# Patient Record
Sex: Male | Born: 1985 | Race: White | Hispanic: No | Marital: Married | Smoking: Former smoker
Health system: Southern US, Community
[De-identification: ages and names within clinical notes are randomized; demographics above are authoritative.]

## PROBLEM LIST (undated history)

## (undated) DIAGNOSIS — Z87442 Personal history of urinary calculi: Secondary | ICD-10-CM

## (undated) DIAGNOSIS — D649 Anemia, unspecified: Secondary | ICD-10-CM

## (undated) DIAGNOSIS — I499 Cardiac arrhythmia, unspecified: Secondary | ICD-10-CM

## (undated) DIAGNOSIS — T8859XA Other complications of anesthesia, initial encounter: Secondary | ICD-10-CM

## (undated) DIAGNOSIS — Z9689 Presence of other specified functional implants: Secondary | ICD-10-CM

## (undated) DIAGNOSIS — I4891 Unspecified atrial fibrillation: Secondary | ICD-10-CM

## (undated) DIAGNOSIS — F909 Attention-deficit hyperactivity disorder, unspecified type: Secondary | ICD-10-CM

## (undated) DIAGNOSIS — R569 Unspecified convulsions: Secondary | ICD-10-CM

## (undated) DIAGNOSIS — M5126 Other intervertebral disc displacement, lumbar region: Secondary | ICD-10-CM

## (undated) DIAGNOSIS — G4730 Sleep apnea, unspecified: Secondary | ICD-10-CM

## (undated) DIAGNOSIS — U071 COVID-19: Secondary | ICD-10-CM

## (undated) HISTORY — DX: Cardiac arrhythmia, unspecified: I49.9

## (undated) HISTORY — PX: FINGER FRACTURE SURGERY: SHX638

## (undated) HISTORY — DX: Unspecified convulsions: R56.9

## (undated) HISTORY — DX: Unspecified atrial fibrillation: I48.91

---

## 2015-12-23 DIAGNOSIS — Z9689 Presence of other specified functional implants: Secondary | ICD-10-CM

## 2015-12-23 HISTORY — DX: Presence of other specified functional implants: Z96.89

## 2016-09-21 HISTORY — PX: IMPLANTATION VAGAL NERVE STIMULATOR: SUR692

## 2017-08-12 ENCOUNTER — Encounter: Payer: Self-pay | Admitting: Registered"

## 2017-08-12 ENCOUNTER — Encounter: Payer: BC Managed Care – PPO | Attending: Surgery | Admitting: Registered"

## 2017-08-12 DIAGNOSIS — Z6841 Body Mass Index (BMI) 40.0 and over, adult: Secondary | ICD-10-CM | POA: Diagnosis present

## 2017-08-12 DIAGNOSIS — E669 Obesity, unspecified: Secondary | ICD-10-CM

## 2017-08-12 NOTE — Progress Notes (Signed)
Pre-Op Assessment Visit:  Pre-Operative Sleeve Gastrectomy Surgery  Medical Nutrition Therapy:  Appt start time: 10:20  End time:  11:34  Patient was seen on 08/12/2017 for Pre-Operative Nutrition Assessment. Assessment and letter of approval faxed to [facility] Bariatric Surgery Program coordinator on 08/12/2017.   Pt expectation of surgery: reduce medications, improve seizures, improve sleep apnea; about 230 lbs  Pt expectation of Dietitian: "get me to where I need to be"; answer questions on what to eat/not eat; good collaboration  Start weight at [facility]: 367.3  BMI: 49.47   Pt states he has gained 150 within last 7 years ago. Pt states he has a lot of support. Pt reports he finds comfort in food. Pt states he does not drink enough water.  Per insurance, pt needs 0 SWL visits prior to surgery.    24 hr Dietary Recall: First Meal: eggs or cereal and chicken nuggets or tuna salad and chicken nuggets Snack: none Second Meal: protein bar Snack: none Third Meal: 3 brats, sweet potato fries Snack: none Beverages: diet soda, water, gatorade  Encouraged to engage in 150 minutes of moderate physical activity including cardiovascular and weight baring weekly  Handouts given during visit include:  . Pre-Op Goals . Bariatric Surgery Protein Shakes . Vitamin and Mineral Recommendations  During the appointment today the following Pre-Op Goals were reviewed with the patient: . Maintain or lose weight as instructed by your surgeon . Make healthy food choices . Begin to limit portion sizes . Limited concentrated sugars and fried foods . Keep fat/sugar in the single digits per serving on          food labels . Practice CHEWING your food  (aim for 30 chews per bite or until applesauce consistency) . Practice not drinking 15 minutes before, during, and 30 minutes after each meal/snack . Avoid all carbonated beverages  . Avoid/limit caffeinated beverages  . Avoid all  sugar-sweetened beverages . Consume 3 meals per day; eat every 3-5 hours . Make a list of non-food related activities . Aim for 64-100 ounces of FLUID daily  . Aim for at least 60-80 grams of PROTEIN daily . Look for a liquid protein source that contain ?15 g protein and ?5 g carbohydrate  (ex: shakes, drinks, shots) . Physical activity is an important part of a healthy lifestyle so keep it moving!  Follow diet recommendations listed below Energy and Macronutrient Recommendations: Calories: 2000 Carbohydrate: 225 Protein: 150 Fat: 56  Demonstrated degree of understanding via:  Teach Back   Teaching Method Utilized:  Visual Auditory Hands on  Barriers to learning/adherence to lifestyle change: none  Patient to call the [facility] to enroll in Pre-Op and Post-Op Nutrition Education when surgery date is scheduled.

## 2017-08-31 ENCOUNTER — Ambulatory Visit (INDEPENDENT_AMBULATORY_CARE_PROVIDER_SITE_OTHER): Payer: BC Managed Care – PPO | Admitting: Cardiology

## 2017-08-31 ENCOUNTER — Encounter: Payer: Self-pay | Admitting: Cardiology

## 2017-08-31 VITALS — BP 118/74 | HR 87 | Ht 73.0 in | Wt 372.8 lb

## 2017-08-31 DIAGNOSIS — I480 Paroxysmal atrial fibrillation: Secondary | ICD-10-CM

## 2017-08-31 DIAGNOSIS — Z01810 Encounter for preprocedural cardiovascular examination: Secondary | ICD-10-CM

## 2017-08-31 DIAGNOSIS — I10 Essential (primary) hypertension: Secondary | ICD-10-CM

## 2017-08-31 MED ORDER — RIVAROXABAN 20 MG PO TABS: 20.0000 mg | ORAL_TABLET | Freq: Every day | ORAL | 1 refills | Status: DC

## 2017-08-31 NOTE — Patient Instructions (Signed)
Medication Instructions:   Your physician has recommended you make the following change in your medication:   CHANGE: Xarelto from 15 mg to 20 mg Once daily.     Labwork:  NONE   Testing/Procedures:  Your physician has requested that you have a lexiscan myoview. For further information please visit [email or web address]. Please follow instruction sheet, as given.    Follow-Up:  Your physician recommends that you schedule a follow-up appointment in: 3 months with Dr. Tomie Chinaevankar.    Any Other Special Instructions Will Be Listed Below (If Applicable).     If you need a refill on your cardiac medications before your next appointment, please call your pharmacy.

## 2017-08-31 NOTE — Progress Notes (Signed)
Cardiology Office Note:    Date:  08/31/2017   ID:  Scott Macdonald, DOB [birth year], MRN [id number]  PCP:  Courtney Heys, PA-C  Cardiologist:  Garwin Brothers, MD   Referring MD: Courtney Heys,*    ASSESSMENT:    1. Pre-operative cardiovascular examination   2. Paroxysmal atrial fibrillation (HCC)   3. Essential hypertension   4. Morbid obesity (HCC)    PLAN:    In order of problems listed above:  1. I discussed my findings with the patient at extensive length. Patient has paroxysmal atrial fibrillation based on this history. I'm requesting records from [facility] including echocardiogram and is ready stress test was performed. 2. His blood pressure is stable at this time. I would hate to duplicate any tests done recently. I will do a Lexiscan sestamibi and echocardiogram result and based on what records I received I we will make further recommendations. 3. I discussed with the patient atrial fibrillation, disease process. Management and therapy including rate and rhythm control, anticoagulation benefits and potential risks were discussed extensively with the patient. Patient had multiple questions which were answered to patient's satisfaction. The patient is on anticoagulation and I presume this short-term but again I we will have all these answered after reviewing his records. At any. Benefits and potential risks of amiodarone therapy was discussed with him and questions were answered to his satisfaction and he vocalized understanding. I'm not sure whether long term amiodarone is a good idea in this gentleman who is a young however I we'll let him use it for a period of time in the perioperative period so it will reducehis morbidities. He erbalized Understanding of this discussion. Again More will be done after reviewing his records. Diet was discussed for obesity and this of obesity explained and he verbalized understanding. He is planning to  undergo bariatric intervention which is why he is here for preoperative consultation.   Medication Adjustments/Labs and Tests Ordered: Current medicines are reviewed at length with the patient today.  Concerns regarding medicines are outlined above.  Orders Placed This Encounter  Procedures  . Myocardial Perfusion Imaging  . EKG 12-Lead   Meds ordered this encounter  Medications  . rivaroxaban (XARELTO) 20 MG TABS tablet    Sig: Take 1 tablet (20 mg total) by mouth daily with supper.    Dispense:  30 tablet    Refill:  1     History of Present Illness:    Scott Macdonald is a 31 y.o. male who is being seen today for the evaluation of pre-op risk stratification from a cardiovascular standpoint at the request of Courtney Heys,*. Patient is a pleasant 31 year old male. He has past medical history of essential hypertension, morbid obesity and paroxysmal atrial fibrillation.I do not have all his records. He has been treated for atrial fibrillation and takes amiodarone 200 mg a day and xarelto 15 mg daily. He denies any chest pain orthopnea or PND. He leads a sedentary lifestyle. 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  Past Medical History:  Diagnosis Date  . A-fib (HCC)   . Arrhythmia   . Seizures (HCC)     History reviewed. No pertinent surgical history.  Current Medications: Current Meds  Medication Sig  . amiodarone (PACERONE) 100 MG tablet Take 400 mg by mouth daily.  Marland Kitchen aspirin 81 MG chewable tablet Chew by mouth daily.  . divalproex (DEPAKOTE) 500 MG DR tablet Take 1,000 mg by mouth  2 (two) times daily.   Marland Kitchen. lisinopril (PRINIVIL,ZESTRIL) 10 MG tablet Take 25 mg by mouth daily.  Marland Kitchen. loratadine (CLARITIN) 10 MG tablet Take 10 mg by mouth daily.  . metoprolol succinate (TOPROL-XL) 100 MG 24 hr tablet Take 50 mg by mouth daily. Take with or immediately following a meal.  . PARoxetine (PAXIL) 10 MG tablet Take 10 mg by mouth daily.  . rivaroxaban (XARELTO) 20  MG TABS tablet Take 1 tablet (20 mg total) by mouth daily with supper.  . [DISCONTINUED] Rivaroxaban (XARELTO) 15 MG TABS tablet Take 20 mg by mouth 2 (two) times daily with a meal.     Allergies:   Lorazepam and Prednisone   Social History   Social History  . Marital status: Unknown    Spouse name: N/A  . Number of children: N/A  . Years of education: N/A   Social History Main Topics  . Smoking status: Former Smoker    Quit date: 09/01/2007  . Smokeless tobacco: Never Used  . Alcohol use Yes     Comment: OCC  . Drug use: No  . Sexual activity: Not Asked   Other Topics Concern  . None   Social History Narrative  . None     Family History: The patient's family history includes COPD in his other; Cancer in his other; Hypertension in his brother, father, mother, and other.  ROS:   Please see the history of present illness.    All other systems reviewed and are negative.  EKGs/Labs/Other Studies Reviewed:    The following studies were reviewed today: I reviewed records on him and questions were answered to his satisfaction. He is currently in sinus rhythm.   Recent Labs: No results found for requested labs within last 8760 hours.  Recent Lipid Panel No results found for: CHOL, TRIG, HDL, CHOLHDL, VLDL, LDLCALC, LDLDIRECT  Physical Exam:    VS:  BP 118/74   Pulse 87   Ht 6\' 1"  (1.854 m)   Wt (!) 372 lb 12.8 oz (169.1 kg)   SpO2 97%   BMI 49.19 kg/m     Wt Readings from Last 3 Encounters:  08/31/17 (!) 372 lb 12.8 oz (169.1 kg)  08/12/17 (!) 367 lb 4.8 oz (166.6 kg)     GEN: Patient is in no acute distress HEENT: Normal NECK: No JVD; No carotid bruits LYMPHATICS: No lymphadenopathy CARDIAC: S1 S2 regular, 2/6 systolic murmur at the apex. RESPIRATORY:  Clear to auscultation without rales, wheezing or rhonchi  ABDOMEN: Soft, non-tender, non-distended MUSCULOSKELETAL:  No edema; No deformity  SKIN: Warm and dry NEUROLOGIC:  Alert and oriented x  3 PSYCHIATRIC:  Normal affect    Signed, Garwin Brothersajan R Nyko Gell, MD  08/31/2017 1:32 PM    [facility]

## 2017-09-01 ENCOUNTER — Telehealth (HOSPITAL_COMMUNITY): Payer: Self-pay | Admitting: *Deleted

## 2017-09-01 NOTE — Telephone Encounter (Signed)
Patient given detailed instructions per Myocardial Perfusion Study Information Sheet for the test on 0912/18 at 1230. Patient notified to arrive 15 minutes early and that it is imperative to arrive on time for appointment to keep from having the test rescheduled.  If you need to cancel or reschedule your appointment, please call the office within 24 hours of your appointment. . Patient verbalized understanding.Caliber Landess W    

## 2017-09-02 ENCOUNTER — Ambulatory Visit (HOSPITAL_COMMUNITY): Payer: BC Managed Care – PPO | Attending: Cardiovascular Disease

## 2017-09-02 DIAGNOSIS — Z01810 Encounter for preprocedural cardiovascular examination: Secondary | ICD-10-CM | POA: Insufficient documentation

## 2017-09-02 MED ORDER — REGADENOSON 0.4 MG/5ML IV SOLN
0.4000 mg | Freq: Once | INTRAVENOUS | Status: AC
  Administered 2017-09-02: 0.4 mg via INTRAVENOUS

## 2017-09-02 MED ORDER — TECHNETIUM TC 99M TETROFOSMIN IV KIT
31.9000 | PACK | Freq: Once | INTRAVENOUS | Status: AC | PRN
  Administered 2017-09-02: 31.9 via INTRAVENOUS
  Filled 2017-09-02: qty 32

## 2017-09-03 ENCOUNTER — Ambulatory Visit (HOSPITAL_COMMUNITY): Payer: BC Managed Care – PPO | Attending: Cardiovascular Disease

## 2017-09-03 LAB — MYOCARDIAL PERFUSION IMAGING
CHL CUP NUCLEAR SSS: 6
CSEPPHR: 114 {beats}/min
LV dias vol: 214 mL (ref 62–150)
LV sys vol: 99 mL
NUC STRESS TID: 1.04
RATE: 0.37
Rest HR: 83 {beats}/min
SDS: 1
SRS: 7

## 2017-09-03 MED ORDER — TECHNETIUM TC 99M TETROFOSMIN IV KIT
31.6000 | PACK | Freq: Once | INTRAVENOUS | Status: AC | PRN
Start: 2017-09-03 — End: 2017-09-03
  Administered 2017-09-03: 31.6 via INTRAVENOUS
  Filled 2017-09-03: qty 32

## 2017-09-14 ENCOUNTER — Encounter: Payer: BC Managed Care – PPO | Attending: Surgery | Admitting: Skilled Nursing Facility1

## 2017-09-14 DIAGNOSIS — Z6841 Body Mass Index (BMI) 40.0 and over, adult: Secondary | ICD-10-CM | POA: Diagnosis present

## 2017-09-15 ENCOUNTER — Encounter: Payer: Self-pay | Admitting: Skilled Nursing Facility1

## 2017-09-15 NOTE — Progress Notes (Signed)
Pre-Operative Nutrition Class:  Appt start time: 1028   End time:  1830.  Patient was seen on 09/14/2017 for Pre-Operative Bariatric Surgery Education at [facility].   Surgery date:  Surgery type: Sleeve Start weight at [facility]: 367.3 Weight today: 370  Samples given per MNT protocol. Patient educated on appropriate usage: Bariatric Advantage Calcium  Lot # [id number] Exp: nov-01-2017  Celebrate Vitamins Multivitamin Lot # [id number] Exp: 11/2018  Renee Pain Protein Powder  Lot #[id number] Exp: 02/2018  The following the learning objectives were met by the patient during this course:  Identify Pre-Op Dietary Goals and will begin 2 weeks pre-operatively  Identify appropriate sources of fluids and proteins   State protein recommendations and appropriate sources pre and post-operatively  Identify Post-Operative Dietary Goals and will follow for 2 weeks post-operatively  Identify appropriate multivitamin and calcium sources  Describe the need for physical activity post-operatively and will follow MD recommendations  State when to call healthcare provider regarding medication questions or post-operative complications  Handouts given during class include:  Pre-Op Bariatric Surgery Diet Handout  Protein Shake Handout  Post-Op Bariatric Surgery Nutrition Handout  [facility] Program Information Flyer  Support Group Information Flyer  [facility] Bariatric Supplements Price List  Follow-Up Plan: Patient will follow-up at [facility] 2 weeks post operatively for diet advancement per MD.

## 2017-09-17 ENCOUNTER — Ambulatory Visit: Payer: BC Managed Care – PPO | Admitting: Cardiology

## 2017-09-17 ENCOUNTER — Ambulatory Visit (INDEPENDENT_AMBULATORY_CARE_PROVIDER_SITE_OTHER): Payer: BC Managed Care – PPO | Admitting: Psychiatry

## 2017-09-17 DIAGNOSIS — F509 Eating disorder, unspecified: Secondary | ICD-10-CM | POA: Diagnosis not present

## 2017-09-24 ENCOUNTER — Ambulatory Visit (INDEPENDENT_AMBULATORY_CARE_PROVIDER_SITE_OTHER): Payer: BC Managed Care – PPO | Admitting: Psychiatry

## 2017-09-24 DIAGNOSIS — F509 Eating disorder, unspecified: Secondary | ICD-10-CM | POA: Diagnosis not present

## 2017-10-08 ENCOUNTER — Other Ambulatory Visit (HOSPITAL_COMMUNITY): Payer: Self-pay | Admitting: Surgery

## 2017-10-12 ENCOUNTER — Ambulatory Visit (HOSPITAL_COMMUNITY)
Admission: RE | Admit: 2017-10-12 | Discharge: 2017-10-12 | Disposition: A | Discharge status: Home / Self Care | Payer: BC Managed Care – PPO | Source: Ambulatory Visit | Attending: Surgery | Admitting: Surgery

## 2017-10-12 ENCOUNTER — Other Ambulatory Visit: Payer: Self-pay

## 2017-10-12 DIAGNOSIS — Z01818 Encounter for other preprocedural examination: Secondary | ICD-10-CM | POA: Diagnosis not present

## 2017-10-21 ENCOUNTER — Ambulatory Visit: Payer: BC Managed Care – PPO | Admitting: Cardiology

## 2017-10-22 ENCOUNTER — Telehealth: Payer: Self-pay | Admitting: Cardiology

## 2017-10-22 NOTE — Telephone Encounter (Signed)
States his clearance letter for bariatric surgery has still not been received

## 2017-10-22 NOTE — Telephone Encounter (Signed)
[facility]

## 2017-10-28 ENCOUNTER — Telehealth: Payer: Self-pay

## 2017-10-28 NOTE — Telephone Encounter (Signed)
Faxed medical clearance form to Armen with Vascular surgery at [phone].

## 2017-10-30 NOTE — Progress Notes (Signed)
Please place orders in Epic as patient has a pre-op appointment on 11/02/2017! Thank you! 

## 2017-11-02 ENCOUNTER — Encounter (HOSPITAL_COMMUNITY)
Admission: RE | Admit: 2017-11-02 | Discharge: 2017-11-02 | Disposition: A | Discharge status: Home / Self Care | Payer: BC Managed Care – PPO | Source: Ambulatory Visit | Attending: Surgery | Admitting: Surgery

## 2017-11-02 NOTE — Progress Notes (Signed)
10/12/2017- noted in Epic- EKG, CXR  09/03/2017-noted in Epic-Stress test  10/12/2017- on chart- labs, CBC w/diff., CMP  10/28/2017- Cardiac Clearance from Dr. Normajean Baxteravankar on chart.

## 2017-11-02 NOTE — Patient Instructions (Signed)
Sanjuana Kavaimothy Schuermann  11/02/2017   Your procedure is scheduled on: Monday 11/09/2017  [facility] [place] to 3rd floor to  Short Stay Center at  0515  AM.     Call this number if you have problems the morning of surgery [phone]   Remember: ONLY 1 PERSON MAY GO WITH YOU TO SHORT STAY TO GET  READY MORNING OF YOUR SURGERY.   Do not eat food or drink liquids :After Midnight.     Take these medicines the morning of surgery with A SIP OF WATER: Metoprolol, Depakote, Paxil                                 You may not have any metal on your body including hair pins and              piercings  Do not wear jewelry, make-up, lotions, powders or perfumes, deodorant             Do not wear nail polish.  Do not shave  48 hours prior to surgery.              Men may shave face and neck.   Do not bring valuables to the hospital.  IS NOT             RESPONSIBLE   FOR VALUABLES.  Contacts, dentures or bridgework may not be worn into surgery.  Leave suitcase in the car. After surgery it may be brought to your room.                  Please read over the following fact sheets you were given: _____________________________________________________________________             [facility] - Preparing for Surgery Before surgery, you can play an important role.  Because skin is not sterile, your skin needs to be as free of germs as possible.  You can reduce the number of germs on your skin by washing with CHG (chlorahexidine gluconate) soap before surgery.  CHG is an antiseptic cleaner which kills germs and bonds with the skin to continue killing germs even after washing. Please DO NOT use if you have an allergy to CHG or antibacterial soaps.  If your skin becomes reddened/irritated stop using the CHG and inform your nurse when you arrive at Short Stay. Do not shave (including legs and underarms) for at least 48 hours prior to the  first CHG shower.  You may shave your face/neck. Please follow these instructions carefully:  1.  Shower with CHG Soap the night before surgery and the  morning of Surgery.  2.  If you choose to wash your hair, wash your hair first as usual with your  normal  shampoo.  3.  After you shampoo, rinse your hair and body thoroughly to remove the  shampoo.                           4.  Use CHG as you would any other liquid soap.  You can apply chg directly  to the skin and wash                       Gently with a scrungie or  clean washcloth.  5.  Apply the CHG Soap to your body ONLY FROM THE NECK DOWN.   Do not use on face/ open                           Wound or open sores. Avoid contact with eyes, ears mouth and genitals (private parts).                       Wash face,  Genitals (private parts) with your normal soap.             6.  Wash thoroughly, paying special attention to the area where your surgery  will be performed.  7.  Thoroughly rinse your body with warm water from the neck down.  8.  DO NOT shower/wash with your normal soap after using and rinsing off  the CHG Soap.                9.  Pat yourself dry with a clean towel.            10.  Wear clean pajamas.            11.  Place clean sheets on your bed the night of your first shower and do not  sleep with pets. Day of Surgery : Do not apply any lotions/deodorants the morning of surgery.  Please wear clean clothes to the hospital/surgery center.  FAILURE TO FOLLOW THESE INSTRUCTIONS MAY RESULT IN THE CANCELLATION OF YOUR SURGERY PATIENT SIGNATURE_________________________________  NURSE SIGNATURE__________________________________  ________________________________________________________________________   [name]  An incentive spirometer is a tool that can help keep your lungs clear and active. This tool measures how well you are filling your lungs with each breath. Taking long deep breaths may help reverse or decrease  the chance of developing breathing (pulmonary) problems (especially infection) following:  A long period of time when you are unable to move or be active. BEFORE THE PROCEDURE   If the spirometer includes an indicator to show your best effort, your nurse or respiratory therapist will set it to a desired goal.  If possible, sit up straight or lean slightly forward. Try not to slouch.  Hold the incentive spirometer in an upright position. INSTRUCTIONS FOR USE  1. Sit on the edge of your bed if possible, or sit up as far as you can in bed or on a chair. 2. Hold the incentive spirometer in an upright position. 3. Breathe out normally. 4. Place the mouthpiece in your mouth and seal your lips tightly around it. 5. Breathe in slowly and as deeply as possible, raising the piston or the ball toward the top of the column. 6. Hold your breath for 3-5 seconds or for as long as possible. Allow the piston or ball to fall to the bottom of the column. 7. Remove the mouthpiece from your mouth and breathe out normally. 8. Rest for a few seconds and repeat Steps 1 through 7 at least 10 times every 1-2 hours when you are awake. Take your time and take a few normal breaths between deep breaths. 9. The spirometer may include an indicator to show your best effort. Use the indicator as a goal to work toward during each repetition. 10. After each set of 10 deep breaths, practice coughing to be sure your lungs are clear. If you have an incision (the cut made at the time of surgery), support your incision when coughing  by placing a pillow or rolled up towels firmly against it. Once you are able to get out of bed, walk around indoors and cough well. You may stop using the incentive spirometer when instructed by your caregiver.  RISKS AND COMPLICATIONS  Take your time so you do not get dizzy or light-headed.  If you are in pain, you may need to take or ask for pain medication before doing incentive spirometry. It is  harder to take a deep breath if you are having pain. AFTER USE  Rest and breathe slowly and easily.  It can be helpful to keep track of a log of your progress. Your caregiver can provide you with a simple table to help with this. If you are using the spirometer at home, follow these instructions: [place] IF:   You are having difficultly using the spirometer.  You have trouble using the spirometer as often as instructed.  Your pain medication is not giving enough relief while using the spirometer.  You develop fever of 100.5 F (38.1 C) or higher. SEEK IMMEDIATE MEDICAL CARE IF:   You cough up bloody sputum that had not been present before.  You develop fever of 102 F (38.9 C) or greater.  You develop worsening pain at or near the incision site. MAKE SURE YOU:   Understand these instructions.  Will watch your condition.  Will get help right away if you are not doing well or get worse. Document Released: 04/20/2007 Document Revised: 03/01/2012 Document Reviewed: 06/21/2007 [facility] Patient Information 2014 [place], [place].   ________________________________________________________________________

## 2017-11-03 NOTE — Progress Notes (Signed)
Please place orders in Epic as patient has a pre-op appointment on 11/06/2017! Thank you! 

## 2017-11-04 ENCOUNTER — Ambulatory Visit: Payer: Self-pay | Admitting: Surgery

## 2017-11-04 NOTE — H&P (Signed)
Scott Macdonald 11/04/2017 3:51 PM Location: [facility] Patient #: [id number] DOB: 05/11/1986 Married / Language: [place] / Race: White Male  History of Present Illness (Shawnelle Spoerl A. Fredricka Bonineonnor MD; 11/04/2017 4:14 PM) Patient words: her today for his preoperative appointment. Has a few new questions which we discussed in detail. He is at his upper GI no hiatal hernia is present, chest x-Huebsch was normal, he has been cleared by psychology as well as dietitians. He's been cleared by cardiology and is deemed not at high risk from a cardiac standpoint for this procedure.  from initial visit: this is a very nice 31 year old man is here today with his wife to discuss. Check surgery. He is interested in sleeve gastrectomy. 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 These seemed to have been getting worse as time goes on in the last year so have become associated with paroxysmal atrial fibrillation although it is not clear whether the seizures trigger than A. fib or vice versa. He also has a history of severe obstructive sleep apnea. He has tried innumerable diets and methods of weight loss and will succeed, he has lost as much as 70 pounds, but he ultimately always gained it back and usually double what he lost. His motivations for pursuing weight loss surgery include the hope that it will improve his sleep apnea and subsequently improve his seizure disorder and his atrial fibrillation, in addition to the fact that he has 3 children for whom he wants to set an example. He discusses that he has a 32-year-old son who is already obese and had imitates everything that he does and he wants to do better example for him. He and his wife also want to have another baby and he feels that he needs to improve  his health significantly before bringing another trial into the world. He feels that he is an excellent support system at home. His mother-in-law actually had a sleeve gastrectomy.  The patient is a 31 year old male who presents for a bariatric surgery evaluation. Associated symptoms include poor self esteem, lost range of motion, joint pains and back pain, while associated symptoms do not include heartburn. Initial onset of obesity was in childhood. Patient's lowest weight in adulthood was 224 pounds. Disease complications include hypertension, sleep apnea and osteoarthritis, while disease complications do not include diabetes. Current diet includes well balanced meals. less than once per week. The patient is currently able to do activities of daily living without limitations and unable to work (due to seizures). Past treatment has included food diary, low calorie diet, low fat diet, exercise regimen, appetite suppressants and weight loss group. Surgical history: other (VNS stimulator in left upper chest for seizures). Cardiac history: The patient has sleep apnea and anti-coagulants (xarelto for paroxysmal a fib). Gastrointestinal History: Patient denies heartburn, dysphagia or irritable bowel disease. MBSQIP recognized comorbidities: Patient has hypertension and sleep apnea.  The patient is a 31 year old male.   Allergies (Tanisha A. Manson PasseyBrown, RMA; 11/04/2017 3:52 PM) PREDNISONE Anaphylaxis. Allergies Reconciled  Medication History (Tanisha A. Manson PasseyBrown, RMA; 11/04/2017 3:52 PM) Claritin (10MG  Tablet, Oral) Active. Divalproex Sodium ER (500MG  Tablet ER 24HR, Oral) Active. Amiodarone HCl (400MG  Tablet, Oral) Active. Metoprolol Succinate ER (50MG  Tablet ER 24HR, Oral) Active. Lisinopril (5MG  Tablet,  Oral) Active. Aspirin (81MG  Tablet, Oral) Active. Xarelto (20MG  Tablet, Oral) Active. PARoxetine HCl (10MG  Tablet, Oral) Active. Medications Reconciled     Review of Systems (Jerika Wales A.  Fredricka Bonineonnor MD; 11/04/2017 4:14 PM) All other systems negative  Vitals (Tanisha A. Brown RMA; 11/04/2017 3:51 PM) 11/04/2017 3:51 PM Weight: 381.6 lb Height: 71in Body Surface Area: 2.78 m Body Mass Index: 53.22 kg/m  Temp.: 97.44F  Pulse: 97 (Regular)  BP: 122/86 (Sitting, Left Arm, Standard)      Physical Exam (Bralynn Donado A. Fredricka Bonineonnor MD; 11/04/2017 4:15 PM)  The physical exam findings are as follows: Note:Alert and well-appearing, in no distress Unlabored respirations Abdomen soft and nontender    Assessment & Plan (Norie Latendresse A. Felcia Huebert MD; 11/04/2017 4:17 PM)  SLEEP APNEA (G47.30)  SEIZURE (R56.9) Story: He takes extended release Depakote which may need to be changed to capsules in the early postoperative period, we'll consult with pharmacy while inpatient  HYPERTENSION (I10) Story: He was told to hold his lisinopril the day of surgery but to continue his metoprolol  ATRIAL FIBRILLATION (I48.91) Story: He has been cleared by cardiology. He will hold his xarelto with last dose to be taken on Friday, surgeriy is monday  MORBID OBESITY (E66.01) Story: He is completely of the preoperative workup with no barriers identified. We again discussed the technique of laparoscopic sleeve gastrectomy, risks involved including bleeding, infection, pain, scarring, intra-abdominal injury, staple line leak, chronic nausea or reflux as well as risks of heart attack, stroke, blood clots, pneumonia, exacerbation of A. fib or seizures. He had many and several questions which were answered. We'll proceed with surgery on the 19th.

## 2017-11-04 NOTE — Progress Notes (Signed)
Please place orders in Epic as patient has a pre-op appointment on 11/05/2017! Thank you! 

## 2017-11-04 NOTE — H&P (View-Only) (Signed)
Scott Macdonald 11/04/2017 3:51 PM Location: [facility] Patient #: [id number] DOB: 05/11/1986 Married / Language: [place] / Race: White Male  History of Present Illness (Ruther Ephraim A. Fredricka Bonineonnor MD; 11/04/2017 4:14 PM) Patient words: her today for his preoperative appointment. Has a few new questions which we discussed in detail. He is at his upper GI no hiatal hernia is present, chest x-Huebsch was normal, he has been cleared by psychology as well as dietitians. He's been cleared by cardiology and is deemed not at high risk from a cardiac standpoint for this procedure.  from initial visit: this is a very nice 31 year old man is here today with his wife to discuss. Check surgery. He is interested in sleeve gastrectomy. 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 These seemed to have been getting worse as time goes on in the last year so have become associated with paroxysmal atrial fibrillation although it is not clear whether the seizures trigger than A. fib or vice versa. He also has a history of severe obstructive sleep apnea. He has tried innumerable diets and methods of weight loss and will succeed, he has lost as much as 70 pounds, but he ultimately always gained it back and usually double what he lost. His motivations for pursuing weight loss surgery include the hope that it will improve his sleep apnea and subsequently improve his seizure disorder and his atrial fibrillation, in addition to the fact that he has 3 children for whom he wants to set an example. He discusses that he has a 32-year-old son who is already obese and had imitates everything that he does and he wants to do better example for him. He and his wife also want to have another baby and he feels that he needs to improve  his health significantly before bringing another trial into the world. He feels that he is an excellent support system at home. His mother-in-law actually had a sleeve gastrectomy.  The patient is a 31 year old male who presents for a bariatric surgery evaluation. Associated symptoms include poor self esteem, lost range of motion, joint pains and back pain, while associated symptoms do not include heartburn. Initial onset of obesity was in childhood. Patient's lowest weight in adulthood was 224 pounds. Disease complications include hypertension, sleep apnea and osteoarthritis, while disease complications do not include diabetes. Current diet includes well balanced meals. less than once per week. The patient is currently able to do activities of daily living without limitations and unable to work (due to seizures). Past treatment has included food diary, low calorie diet, low fat diet, exercise regimen, appetite suppressants and weight loss group. Surgical history: other (VNS stimulator in left upper chest for seizures). Cardiac history: The patient has sleep apnea and anti-coagulants (xarelto for paroxysmal a fib). Gastrointestinal History: Patient denies heartburn, dysphagia or irritable bowel disease. MBSQIP recognized comorbidities: Patient has hypertension and sleep apnea.  The patient is a 31 year old male.   Allergies (Tanisha A. Manson PasseyBrown, RMA; 11/04/2017 3:52 PM) PREDNISONE Anaphylaxis. Allergies Reconciled  Medication History (Tanisha A. Manson PasseyBrown, RMA; 11/04/2017 3:52 PM) Claritin (10MG  Tablet, Oral) Active. Divalproex Sodium ER (500MG  Tablet ER 24HR, Oral) Active. Amiodarone HCl (400MG  Tablet, Oral) Active. Metoprolol Succinate ER (50MG  Tablet ER 24HR, Oral) Active. Lisinopril (5MG  Tablet,  Oral) Active. Aspirin (81MG Tablet, Oral) Active. Xarelto (20MG Tablet, Oral) Active. PARoxetine HCl (10MG Tablet, Oral) Active. Medications Reconciled     Review of Systems (Derrek Puff A.  Harveer Sadler MD; 11/04/2017 4:14 PM) All other systems negative  Vitals (Tanisha A. Brown RMA; 11/04/2017 3:51 PM) 11/04/2017 3:51 PM Weight: 381.6 lb Height: 71in Body Surface Area: 2.78 m Body Mass Index: 53.22 kg/m  Temp.: 97.7F  Pulse: 97 (Regular)  BP: 122/86 (Sitting, Left Arm, Standard)      Physical Exam (Kamel Haven A. Pj Zehner MD; 11/04/2017 4:15 PM)  The physical exam findings are as follows: Note:Alert and well-appearing, in no distress Unlabored respirations Abdomen soft and nontender    Assessment & Plan (Kharson Rasmusson A. Chadwin Fury MD; 11/04/2017 4:17 PM)  SLEEP APNEA (G47.30)  SEIZURE (R56.9) Story: He takes extended release Depakote which may need to be changed to capsules in the early postoperative period, we'll consult with pharmacy while inpatient  HYPERTENSION (I10) Story: He was told to hold his lisinopril the day of surgery but to continue his metoprolol  ATRIAL FIBRILLATION (I48.91) Story: He has been cleared by cardiology. He will hold his xarelto with last dose to be taken on Friday, surgeriy is monday  MORBID OBESITY (E66.01) Story: He is completely of the preoperative workup with no barriers identified. We again discussed the technique of laparoscopic sleeve gastrectomy, risks involved including bleeding, infection, pain, scarring, intra-abdominal injury, staple line leak, chronic nausea or reflux as well as risks of heart attack, stroke, blood clots, pneumonia, exacerbation of A. fib or seizures. He had many and several questions which were answered. We'll proceed with surgery on the 19th. 

## 2017-11-05 NOTE — Progress Notes (Signed)
Cardiology [facility]17 [facility]  EKG 10-12-17 epic   Stress Test 09-03-17 epic   LOV cardiology Dr Tomie Chinaevankar 08-31-17 epic   Cbcdiff, cmp 10-12-17 on chart done by Dr Phylliss Blakeshelsea Connor  CXR 10-12-17 epic

## 2017-11-05 NOTE — Patient Instructions (Addendum)
Scott Macdonald  11/05/2017   Your procedure is scheduled on: 11-09-17  [facility]  Entrance [place]  elevators to 3rd floor to  Short Stay Center at [phone].   Call this number if you have problems the morning of surgery [phone]   Remember: ONLY 1 PERSON MAY GO WITH YOU TO SHORT STAY TO GET  READY MORNING OF YOUR SURGERY.  Do not eat food or drink liquids :After Midnight.     Take these medicines the morning of surgery with A SIP OF WATER: Depakote, Paxil, metoprolol, flecainide (tambocor) , loratadine                                 You may not have any metal on your body including hair pins and              piercings  Do not wear jewelry, make-up, lotions, powders or perfumes, deodorant                        Men may shave face and neck.   Do not bring valuables to the hospital. [facility] IS NOT             RESPONSIBLE   FOR VALUABLES.  Contacts, dentures or bridgework may not be worn into surgery.  Leave suitcase in the car. After surgery it may be brought to your room.                 Please read over the following fact sheets you were given: _____________________________________________________________________            [facility] - Preparing for Surgery Before surgery, you can play an important role.  Because skin is not sterile, your skin needs to be as free of germs as possible.  You can reduce the number of germs on your skin by washing with CHG (chlorahexidine gluconate) soap before surgery.  CHG is an antiseptic cleaner which kills germs and bonds with the skin to continue killing germs even after washing. Please DO NOT use if you have an allergy to CHG or antibacterial soaps.  If your skin becomes reddened/irritated stop using the CHG and inform your nurse when you arrive at Short Stay. Do not shave (including legs and underarms) for at least 48 hours prior to the first CHG shower.  You may shave your face/neck. Please  follow these instructions carefully:  1.  Shower with CHG Soap the night before surgery and the  morning of Surgery.  2.  If you choose to wash your hair, wash your hair first as usual with your  normal  shampoo.  3.  After you shampoo, rinse your hair and body thoroughly to remove the  shampoo.                           4.  Use CHG as you would any other liquid soap.  You can apply chg directly  to the skin and wash                       Gently with a scrungie or clean washcloth.  5.  Apply the CHG Soap to your body ONLY FROM THE NECK DOWN.   Do not use  on face/ open                           Wound or open sores. Avoid contact with eyes, ears mouth and genitals (private parts).                       Wash face,  Genitals (private parts) with your normal soap.             6.  Wash thoroughly, paying special attention to the area where your surgery  will be performed.  7.  Thoroughly rinse your body with warm water from the neck down.  8.  DO NOT shower/wash with your normal soap after using and rinsing off  the CHG Soap.                9.  Pat yourself dry with a clean towel.            10.  Wear clean pajamas.            11.  Place clean sheets on your bed the night of your first shower and do not  sleep with pets. Day of Surgery : Do not apply any lotions/deodorants the morning of surgery.  Please wear clean clothes to the hospital/surgery center.  FAILURE TO FOLLOW THESE INSTRUCTIONS MAY RESULT IN THE CANCELLATION OF YOUR SURGERY PATIENT SIGNATURE_________________________________  NURSE SIGNATURE__________________________________  ________________________________________________________________________   Scott Macdonald  An incentive spirometer is a tool that can help keep your lungs clear and active. This tool measures how well you are filling your lungs with each breath. Taking long deep breaths may help reverse or decrease the chance of developing breathing (pulmonary) problems  (especially infection) following:  A long period of time when you are unable to move or be active. BEFORE THE PROCEDURE   If the spirometer includes an indicator to show your best effort, your nurse or respiratory therapist will set it to a desired goal.  If possible, sit up straight or lean slightly forward. Try not to slouch.  Hold the incentive spirometer in an upright position. INSTRUCTIONS FOR USE  1. Sit on the edge of your bed if possible, or sit up as far as you can in bed or on a chair. 2. Hold the incentive spirometer in an upright position. 3. Breathe out normally. 4. Place the mouthpiece in your mouth and seal your lips tightly around it. 5. Breathe in slowly and as deeply as possible, raising the piston or the ball toward the top of the column. 6. Hold your breath for 3-5 seconds or for as long as possible. Allow the piston or ball to fall to the bottom of the column. 7. Remove the mouthpiece from your mouth and breathe out normally. 8. Rest for a few seconds and repeat Steps 1 through 7 at least 10 times every 1-2 hours when you are awake. Take your time and take a few normal breaths between deep breaths. 9. The spirometer may include an indicator to show your best effort. Use the indicator as a goal to work toward during each repetition. 10. After each set of 10 deep breaths, practice coughing to be sure your lungs are clear. If you have an incision (the cut made at the time of surgery), support your incision when coughing by placing a pillow or rolled up towels firmly against it. Once you are able to get out of bed, walk around  indoors and cough well. You may stop using the incentive spirometer when instructed by your caregiver.  RISKS AND COMPLICATIONS  Take your time so you do not get dizzy or light-headed.  If you are in pain, you may need to take or ask for pain medication before doing incentive spirometry. It is harder to take a deep breath if you are having  pain. AFTER USE  Rest and breathe slowly and easily.  It can be helpful to keep track of a log of your progress. Your caregiver can provide you with a simple table to help with this. If you are using the spirometer at home, follow these instructions: [place] IF:   You are having difficultly using the spirometer.  You have trouble using the spirometer as often as instructed.  Your pain medication is not giving enough relief while using the spirometer.  You develop fever of 100.5 F (38.1 C) or higher. SEEK IMMEDIATE MEDICAL CARE IF:   You cough up bloody sputum that had not been present before.  You develop fever of 102 F (38.9 C) or greater.  You develop worsening pain at or near the incision site. MAKE SURE YOU:   Understand these instructions.  Will watch your condition.  Will get help right away if you are not doing well or get worse. Document Released: 04/20/2007 Document Revised: 03/01/2012 Document Reviewed: 06/21/2007 [facility] Patient Information 2014 [place], [place].   ________________________________________________________________________

## 2017-11-06 ENCOUNTER — Encounter (HOSPITAL_COMMUNITY)
Admission: RE | Admit: 2017-11-06 | Discharge: 2017-11-06 | Disposition: A | Discharge status: Home / Self Care | Payer: BC Managed Care – PPO | Source: Ambulatory Visit | Attending: Surgery | Admitting: Surgery

## 2017-11-06 ENCOUNTER — Encounter (HOSPITAL_COMMUNITY): Payer: Self-pay

## 2017-11-06 ENCOUNTER — Other Ambulatory Visit: Payer: Self-pay

## 2017-11-06 HISTORY — DX: Other intervertebral disc displacement, lumbar region: M51.26

## 2017-11-06 HISTORY — DX: Sleep apnea, unspecified: G47.30

## 2017-11-06 HISTORY — DX: Presence of other specified functional implants: Z96.89

## 2017-11-06 LAB — PROTIME-INR
INR: 1.28
Prothrombin Time: 15.9 seconds — ABNORMAL HIGH (ref 11.4–15.2)

## 2017-11-06 LAB — APTT: APTT: 35 s (ref 24–36)

## 2017-11-06 NOTE — Progress Notes (Signed)
PTINR routed via epic to dr Phylliss Blakeschelsea connor

## 2017-11-09 ENCOUNTER — Encounter (HOSPITAL_COMMUNITY)
Admission: RE | Disposition: A | Discharge status: Home / Self Care | Payer: Self-pay | Source: Ambulatory Visit | Attending: Surgery

## 2017-11-09 ENCOUNTER — Inpatient Hospital Stay (HOSPITAL_COMMUNITY): Payer: BC Managed Care – PPO | Admitting: Certified Registered Nurse Anesthetist

## 2017-11-09 ENCOUNTER — Encounter (HOSPITAL_COMMUNITY): Payer: Self-pay | Admitting: Emergency Medicine

## 2017-11-09 ENCOUNTER — Other Ambulatory Visit: Payer: Self-pay

## 2017-11-09 ENCOUNTER — Inpatient Hospital Stay (HOSPITAL_COMMUNITY)
Admission: RE | Admit: 2017-11-09 | Discharge: 2017-11-10 | DRG: 621 | Disposition: A | Discharge status: Home / Self Care | Payer: BC Managed Care – PPO | Source: Ambulatory Visit | Attending: Surgery | Admitting: Surgery

## 2017-11-09 DIAGNOSIS — Z7901 Long term (current) use of anticoagulants: Secondary | ICD-10-CM | POA: Diagnosis not present

## 2017-11-09 DIAGNOSIS — Z87891 Personal history of nicotine dependence: Secondary | ICD-10-CM | POA: Diagnosis not present

## 2017-11-09 DIAGNOSIS — Z9689 Presence of other specified functional implants: Secondary | ICD-10-CM | POA: Diagnosis present

## 2017-11-09 DIAGNOSIS — G4733 Obstructive sleep apnea (adult) (pediatric): Secondary | ICD-10-CM | POA: Diagnosis present

## 2017-11-09 DIAGNOSIS — Z79899 Other long term (current) drug therapy: Secondary | ICD-10-CM | POA: Diagnosis not present

## 2017-11-09 DIAGNOSIS — Z7982 Long term (current) use of aspirin: Secondary | ICD-10-CM

## 2017-11-09 DIAGNOSIS — I480 Paroxysmal atrial fibrillation: Secondary | ICD-10-CM | POA: Diagnosis present

## 2017-11-09 DIAGNOSIS — I10 Essential (primary) hypertension: Secondary | ICD-10-CM | POA: Diagnosis present

## 2017-11-09 DIAGNOSIS — Z6841 Body Mass Index (BMI) 40.0 and over, adult: Secondary | ICD-10-CM

## 2017-11-09 DIAGNOSIS — G40909 Epilepsy, unspecified, not intractable, without status epilepticus: Secondary | ICD-10-CM | POA: Diagnosis present

## 2017-11-09 HISTORY — PX: LAPAROSCOPIC GASTRIC SLEEVE RESECTION: SHX5895

## 2017-11-09 LAB — HEMOGLOBIN AND HEMATOCRIT, BLOOD
HCT: 41.6 % (ref 39.0–52.0)
HEMOGLOBIN: 13.7 g/dL (ref 13.0–17.0)

## 2017-11-09 SURGERY — GASTRECTOMY, SLEEVE, LAPAROSCOPIC
Anesthesia: General

## 2017-11-09 MED ORDER — PROPOFOL 10 MG/ML IV BOLUS
INTRAVENOUS | Status: AC
  Filled 2017-11-09: qty 20

## 2017-11-09 MED ORDER — PROMETHAZINE HCL 25 MG/ML IJ SOLN: 6.2500 mg | INTRAMUSCULAR | Status: DC | PRN

## 2017-11-09 MED ORDER — FENTANYL CITRATE (PF) 250 MCG/5ML IJ SOLN
INTRAMUSCULAR | Status: AC
  Filled 2017-11-09: qty 5

## 2017-11-09 MED ORDER — SCOPOLAMINE 1 MG/3DAYS TD PT72
MEDICATED_PATCH | TRANSDERMAL | Status: AC
  Filled 2017-11-09: qty 1

## 2017-11-09 MED ORDER — LIDOCAINE 2% (20 MG/ML) 5 ML SYRINGE
INTRAMUSCULAR | Status: DC | PRN
  Administered 2017-11-09: 100 mg via INTRAVENOUS

## 2017-11-09 MED ORDER — HYDROMORPHONE HCL 1 MG/ML IJ SOLN
0.5000 mg | INTRAMUSCULAR | Status: DC | PRN
Start: 2017-11-09 — End: 2017-11-10
  Administered 2017-11-09: 0.5 mg via INTRAVENOUS
  Filled 2017-11-09: qty 0.5

## 2017-11-09 MED ORDER — 0.9 % SODIUM CHLORIDE (POUR BTL) OPTIME
TOPICAL | Status: DC | PRN
  Administered 2017-11-09: 1000 mL

## 2017-11-09 MED ORDER — OXYCODONE HCL 5 MG/5ML PO SOLN
5.0000 mg | ORAL | Status: DC | PRN
  Administered 2017-11-10 (×2): 5 mg via ORAL
  Filled 2017-11-09 (×2): qty 5

## 2017-11-09 MED ORDER — CHLORHEXIDINE GLUCONATE 4 % EX LIQD: 60.0000 mL | Freq: Once | CUTANEOUS | Status: DC

## 2017-11-09 MED ORDER — LIDOCAINE 2% (20 MG/ML) 5 ML SYRINGE
INTRAMUSCULAR | Status: DC | PRN
  Administered 2017-11-09: 1.5 mg/kg/h via INTRAVENOUS

## 2017-11-09 MED ORDER — LACTATED RINGERS IV SOLN
INTRAVENOUS | Status: DC | PRN
  Administered 2017-11-09: 07:00:00 via INTRAVENOUS

## 2017-11-09 MED ORDER — LORATADINE 10 MG PO TABS
10.0000 mg | ORAL_TABLET | Freq: Every day | ORAL | Status: DC
  Administered 2017-11-10: 10 mg via ORAL
  Filled 2017-11-09: qty 1

## 2017-11-09 MED ORDER — CEFOTETAN DISODIUM-DEXTROSE 2-2.08 GM-%(50ML) IV SOLR
2.0000 g | INTRAVENOUS | Status: AC
  Administered 2017-11-09: 2 g via INTRAVENOUS

## 2017-11-09 MED ORDER — CEFOTETAN DISODIUM-DEXTROSE 2-2.08 GM-%(50ML) IV SOLR
INTRAVENOUS | Status: AC
Start: 2017-11-09 — End: 2017-11-09
  Filled 2017-11-09: qty 50

## 2017-11-09 MED ORDER — PAROXETINE HCL 20 MG PO TABS
10.0000 mg | ORAL_TABLET | Freq: Every day | ORAL | Status: DC
  Administered 2017-11-10: 10 mg via ORAL
  Filled 2017-11-09: qty 1

## 2017-11-09 MED ORDER — ROCURONIUM BROMIDE 50 MG/5ML IV SOSY
PREFILLED_SYRINGE | INTRAVENOUS | Status: AC
  Filled 2017-11-09: qty 5

## 2017-11-09 MED ORDER — METOPROLOL TARTRATE 5 MG/5ML IV SOLN: 5.0000 mg | Freq: Four times a day (QID) | INTRAVENOUS | Status: DC | PRN

## 2017-11-09 MED ORDER — KETAMINE HCL 10 MG/ML IJ SOLN
INTRAMUSCULAR | Status: AC
  Filled 2017-11-09: qty 1

## 2017-11-09 MED ORDER — METOPROLOL SUCCINATE ER 50 MG PO TB24
50.0000 mg | ORAL_TABLET | Freq: Every day | ORAL | Status: DC
  Administered 2017-11-10: 50 mg via ORAL
  Filled 2017-11-09: qty 1

## 2017-11-09 MED ORDER — GABAPENTIN 300 MG PO CAPS
300.0000 mg | ORAL_CAPSULE | ORAL | Status: AC
  Administered 2017-11-09: 300 mg via ORAL
  Filled 2017-11-09: qty 1

## 2017-11-09 MED ORDER — SUGAMMADEX SODIUM 500 MG/5ML IV SOLN
INTRAVENOUS | Status: DC | PRN
  Administered 2017-11-09: 343 mg via INTRAVENOUS

## 2017-11-09 MED ORDER — PROPOFOL 10 MG/ML IV BOLUS
INTRAVENOUS | Status: DC | PRN
  Administered 2017-11-09: 250 mg via INTRAVENOUS

## 2017-11-09 MED ORDER — HYDROMORPHONE HCL 1 MG/ML IJ SOLN
0.2500 mg | INTRAMUSCULAR | Status: DC | PRN
  Administered 2017-11-09: 0.5 mg via INTRAVENOUS

## 2017-11-09 MED ORDER — DIVALPROEX SODIUM 125 MG PO CSDR
500.0000 mg | DELAYED_RELEASE_CAPSULE | Freq: Four times a day (QID) | ORAL | Status: DC
  Administered 2017-11-09 – 2017-11-10 (×4): 500 mg via ORAL
  Filled 2017-11-09 (×6): qty 4

## 2017-11-09 MED ORDER — EPHEDRINE SULFATE-NACL 50-0.9 MG/10ML-% IV SOSY
PREFILLED_SYRINGE | INTRAVENOUS | Status: DC | PRN
  Administered 2017-11-09: 10 mg via INTRAVENOUS
  Administered 2017-11-09: 5 mg via INTRAVENOUS
  Administered 2017-11-09: 10 mg via INTRAVENOUS
  Administered 2017-11-09: 15 mg via INTRAVENOUS

## 2017-11-09 MED ORDER — SUCCINYLCHOLINE CHLORIDE 200 MG/10ML IV SOSY
PREFILLED_SYRINGE | INTRAVENOUS | Status: AC
  Filled 2017-11-09: qty 10

## 2017-11-09 MED ORDER — BUPIVACAINE-EPINEPHRINE 0.25% -1:200000 IJ SOLN
INTRAMUSCULAR | Status: DC | PRN
Start: 2017-11-09 — End: 2017-11-09
  Administered 2017-11-09: 30 mL

## 2017-11-09 MED ORDER — ROCURONIUM BROMIDE 50 MG/5ML IV SOSY
PREFILLED_SYRINGE | INTRAVENOUS | Status: DC | PRN
  Administered 2017-11-09: 20 mg via INTRAVENOUS
  Administered 2017-11-09: 30 mg via INTRAVENOUS
  Administered 2017-11-09: 50 mg via INTRAVENOUS

## 2017-11-09 MED ORDER — OXYCODONE HCL 5 MG PO TABS: 5.0000 mg | ORAL_TABLET | Freq: Once | ORAL | Status: DC | PRN

## 2017-11-09 MED ORDER — LIDOCAINE HCL 2 % IJ SOLN
INTRAMUSCULAR | Status: AC
  Filled 2017-11-09: qty 20

## 2017-11-09 MED ORDER — LACTATED RINGERS IR SOLN
Status: DC | PRN
  Administered 2017-11-09: 1000 mL

## 2017-11-09 MED ORDER — ACETAMINOPHEN 500 MG PO TABS
1000.0000 mg | ORAL_TABLET | ORAL | Status: AC
  Administered 2017-11-09: 1000 mg via ORAL
  Filled 2017-11-09: qty 2

## 2017-11-09 MED ORDER — SODIUM CHLORIDE 0.9 % IV SOLN
INTRAVENOUS | Status: DC
  Administered 2017-11-09 (×2): via INTRAVENOUS
  Administered 2017-11-10: 1000 mL via INTRAVENOUS

## 2017-11-09 MED ORDER — EPHEDRINE 5 MG/ML INJ
INTRAVENOUS | Status: AC
  Filled 2017-11-09: qty 10

## 2017-11-09 MED ORDER — PHENYLEPHRINE 40 MCG/ML (10ML) SYRINGE FOR IV PUSH (FOR BLOOD PRESSURE SUPPORT)
PREFILLED_SYRINGE | INTRAVENOUS | Status: DC | PRN
  Administered 2017-11-09: 40 ug via INTRAVENOUS

## 2017-11-09 MED ORDER — LACTATED RINGERS IV SOLN: INTRAVENOUS | Status: DC

## 2017-11-09 MED ORDER — HYDROMORPHONE HCL 1 MG/ML IJ SOLN
INTRAMUSCULAR | Status: AC
  Filled 2017-11-09: qty 1

## 2017-11-09 MED ORDER — FENTANYL CITRATE (PF) 100 MCG/2ML IJ SOLN
INTRAMUSCULAR | Status: DC | PRN
  Administered 2017-11-09 (×3): 50 ug via INTRAVENOUS

## 2017-11-09 MED ORDER — LIDOCAINE 2% (20 MG/ML) 5 ML SYRINGE
INTRAMUSCULAR | Status: AC
  Filled 2017-11-09: qty 5

## 2017-11-09 MED ORDER — OXYCODONE HCL 5 MG/5ML PO SOLN
5.0000 mg | Freq: Once | ORAL | Status: DC | PRN
  Filled 2017-11-09: qty 5

## 2017-11-09 MED ORDER — SUGAMMADEX SODIUM 500 MG/5ML IV SOLN
INTRAVENOUS | Status: AC
  Filled 2017-11-09: qty 5

## 2017-11-09 MED ORDER — ACETAMINOPHEN 160 MG/5ML PO SOLN: 650.0000 mg | ORAL | Status: DC | PRN

## 2017-11-09 MED ORDER — APREPITANT 40 MG PO CAPS
40.0000 mg | ORAL_CAPSULE | ORAL | Status: AC
  Administered 2017-11-09: 40 mg via ORAL
  Filled 2017-11-09: qty 1

## 2017-11-09 MED ORDER — SCOPOLAMINE 1 MG/3DAYS TD PT72
1.0000 | MEDICATED_PATCH | TRANSDERMAL | Status: AC
  Administered 2017-11-09: 1 via TRANSDERMAL
  Administered 2017-11-09: 1.5 mg via TRANSDERMAL
  Filled 2017-11-09: qty 1

## 2017-11-09 MED ORDER — MEPERIDINE HCL 50 MG/ML IJ SOLN: 6.2500 mg | INTRAMUSCULAR | Status: DC | PRN

## 2017-11-09 MED ORDER — PREMIER PROTEIN SHAKE
2.0000 [oz_av] | ORAL | Status: DC
  Administered 2017-11-10 (×3): 2 [oz_av] via ORAL

## 2017-11-09 MED ORDER — ONDANSETRON HCL 4 MG/2ML IJ SOLN
4.0000 mg | INTRAMUSCULAR | Status: DC | PRN
Start: 2017-11-09 — End: 2017-11-10

## 2017-11-09 MED ORDER — PHENYLEPHRINE 40 MCG/ML (10ML) SYRINGE FOR IV PUSH (FOR BLOOD PRESSURE SUPPORT)
PREFILLED_SYRINGE | INTRAVENOUS | Status: AC
  Filled 2017-11-09: qty 10

## 2017-11-09 MED ORDER — ONDANSETRON HCL 4 MG/2ML IJ SOLN
INTRAMUSCULAR | Status: DC | PRN
  Administered 2017-11-09: 4 mg via INTRAVENOUS

## 2017-11-09 MED ORDER — BUPIVACAINE-EPINEPHRINE (PF) 0.25% -1:200000 IJ SOLN
INTRAMUSCULAR | Status: AC
  Filled 2017-11-09: qty 30

## 2017-11-09 MED ORDER — ONDANSETRON HCL 4 MG/2ML IJ SOLN
INTRAMUSCULAR | Status: AC
  Filled 2017-11-09: qty 2

## 2017-11-09 MED ORDER — ENOXAPARIN SODIUM 40 MG/0.4ML ~~LOC~~ SOLN
40.0000 mg | SUBCUTANEOUS | Status: AC
  Administered 2017-11-09: 40 mg via SUBCUTANEOUS
  Filled 2017-11-09: qty 0.4

## 2017-11-09 MED ORDER — SUCCINYLCHOLINE CHLORIDE 200 MG/10ML IV SOSY
PREFILLED_SYRINGE | INTRAVENOUS | Status: DC | PRN
  Administered 2017-11-09: 140 mg via INTRAVENOUS

## 2017-11-09 MED ORDER — FLECAINIDE ACETATE 100 MG PO TABS
100.0000 mg | ORAL_TABLET | Freq: Two times a day (BID) | ORAL | Status: DC
  Administered 2017-11-10 (×2): 100 mg via ORAL
  Filled 2017-11-09 (×2): qty 1

## 2017-11-09 MED ORDER — CELECOXIB 200 MG PO CAPS
400.0000 mg | ORAL_CAPSULE | ORAL | Status: AC
  Administered 2017-11-09: 400 mg via ORAL
  Filled 2017-11-09: qty 2

## 2017-11-09 MED ORDER — BUPIVACAINE LIPOSOME 1.3 % IJ SUSP
20.0000 mL | Freq: Once | INTRAMUSCULAR | Status: AC
  Administered 2017-11-09: 20 mL
  Filled 2017-11-09: qty 20

## 2017-11-09 MED ORDER — ENOXAPARIN SODIUM 30 MG/0.3ML ~~LOC~~ SOLN
30.0000 mg | Freq: Two times a day (BID) | SUBCUTANEOUS | Status: DC
  Administered 2017-11-09 – 2017-11-10 (×2): 30 mg via SUBCUTANEOUS
  Filled 2017-11-09 (×2): qty 0.3

## 2017-11-09 MED ORDER — KETAMINE HCL 10 MG/ML IJ SOLN
INTRAMUSCULAR | Status: DC | PRN
  Administered 2017-11-09: 40 mg via INTRAVENOUS

## 2017-11-09 MED ORDER — HYDRALAZINE HCL 20 MG/ML IJ SOLN: 10.0000 mg | INTRAMUSCULAR | Status: DC | PRN

## 2017-11-09 MED ORDER — PANTOPRAZOLE SODIUM 40 MG IV SOLR
40.0000 mg | Freq: Every day | INTRAVENOUS | Status: DC
  Administered 2017-11-09: 40 mg via INTRAVENOUS
  Filled 2017-11-09: qty 40

## 2017-11-09 MED ORDER — SIMETHICONE 80 MG PO CHEW: 80.0000 mg | CHEWABLE_TABLET | Freq: Four times a day (QID) | ORAL | Status: DC | PRN

## 2017-11-09 SURGICAL SUPPLY — 67 items
APPLICATOR COTTON TIP 6IN STRL (MISCELLANEOUS) IMPLANT
APPLIER CLIP ROT 10 11.4 M/L (STAPLE)
APPLIER CLIP ROT 13.4 12 LRG (CLIP)
BAG LAPAROSCOPIC 12 15 PORT 16 (BASKET) IMPLANT
BAG RETRIEVAL 12/15 (BASKET)
BAG RETRIEVAL 12/15MM (BASKET)
BANDAGE ADH SHEER 1  50/CT (GAUZE/BANDAGES/DRESSINGS) ×18 IMPLANT
BENZOIN TINCTURE PRP APPL 2/3 (GAUZE/BANDAGES/DRESSINGS) ×3 IMPLANT
BLADE SURG SZ11 CARB STEEL (BLADE) ×3 IMPLANT
CABLE HIGH FREQUENCY MONO STRZ (ELECTRODE) ×3 IMPLANT
CHLORAPREP W/TINT 26ML (MISCELLANEOUS) ×6 IMPLANT
CLIP APPLIE ROT 10 11.4 M/L (STAPLE) IMPLANT
CLIP APPLIE ROT 13.4 12 LRG (CLIP) IMPLANT
CLOSURE WOUND 1/2 X4 (GAUZE/BANDAGES/DRESSINGS) ×1
COVER SURGICAL LIGHT HANDLE (MISCELLANEOUS) ×3 IMPLANT
DECANTER SPIKE VIAL GLASS SM (MISCELLANEOUS) ×3 IMPLANT
DEVICE SUT QUICK LOAD TK 5 (STAPLE) IMPLANT
DEVICE SUT TI-KNOT TK 5X26 (MISCELLANEOUS) IMPLANT
DEVICE TI KNOT TK5 (MISCELLANEOUS)
DRAPE UTILITY XL STRL (DRAPES) ×6 IMPLANT
ELECT REM PT RETURN 15FT ADLT (MISCELLANEOUS) ×3 IMPLANT
GAUZE SPONGE 2X2 8PLY STRL LF (GAUZE/BANDAGES/DRESSINGS) ×1 IMPLANT
GAUZE SPONGE 4X4 12PLY STRL (GAUZE/BANDAGES/DRESSINGS) IMPLANT
GLOVE BIO SURGEON STRL SZ 6 (GLOVE) ×3 IMPLANT
GLOVE INDICATOR 6.5 STRL GRN (GLOVE) ×3 IMPLANT
GOWN STRL REUS W/TWL LRG LVL3 (GOWN DISPOSABLE) ×3 IMPLANT
GOWN STRL REUS W/TWL XL LVL3 (GOWN DISPOSABLE) ×6 IMPLANT
GRASPER SUT TROCAR 14GX15 (MISCELLANEOUS) ×3 IMPLANT
HOVERMATT SINGLE USE (MISCELLANEOUS) ×3 IMPLANT
KIT BASIN OR (CUSTOM PROCEDURE TRAY) ×3 IMPLANT
MARKER SKIN DUAL TIP RULER LAB (MISCELLANEOUS) ×3 IMPLANT
NEEDLE SPNL 22GX3.5 QUINCKE BK (NEEDLE) ×3 IMPLANT
PACK UNIVERSAL I (CUSTOM PROCEDURE TRAY) ×3 IMPLANT
QUICK LOAD TK 5 (STAPLE)
RELOAD ENDO STITCH (ENDOMECHANICALS) IMPLANT
RELOAD STAPLER BLUE 60MM (STAPLE) ×3 IMPLANT
RELOAD STAPLER GOLD 60MM (STAPLE) ×1 IMPLANT
RELOAD STAPLER GREEN 60MM (STAPLE) ×1 IMPLANT
SCISSORS LAP 5X45 EPIX DISP (ENDOMECHANICALS) ×3 IMPLANT
SET IRRIG TUBING LAPAROSCOPIC (IRRIGATION / IRRIGATOR) ×3 IMPLANT
SHEARS HARMONIC ACE PLUS 45CM (MISCELLANEOUS) ×3 IMPLANT
SLEEVE ADV FIXATION 5X100MM (TROCAR) ×6 IMPLANT
SLEEVE GASTRECTOMY 40FR VISIGI (MISCELLANEOUS) ×3 IMPLANT
SOLUTION ANTI FOG 6CC (MISCELLANEOUS) ×3 IMPLANT
SPONGE GAUZE 2X2 STER 10/PKG (GAUZE/BANDAGES/DRESSINGS) ×2
SPONGE LAP 18X18 X RAY DECT (DISPOSABLE) ×3 IMPLANT
STAPLER ECHELON BIOABSB 60 FLE (MISCELLANEOUS) ×18 IMPLANT
STAPLER ECHELON LONG 60 440 (INSTRUMENTS) ×3 IMPLANT
STAPLER RELOAD BLUE 60MM (STAPLE) ×9
STAPLER RELOAD GOLD 60MM (STAPLE) ×3
STAPLER RELOAD GREEN 60MM (STAPLE) ×3
STRIP CLOSURE SKIN 1/2X4 (GAUZE/BANDAGES/DRESSINGS) ×2 IMPLANT
SUT MNCRL AB 4-0 PS2 18 (SUTURE) ×3 IMPLANT
SUT SURGIDAC NAB ES-9 0 48 120 (SUTURE) IMPLANT
SUT VICRYL 0 TIES 12 18 (SUTURE) ×3 IMPLANT
SYR 10ML ECCENTRIC (SYRINGE) ×3 IMPLANT
SYR 20CC LL (SYRINGE) ×3 IMPLANT
SYR 50ML LL SCALE MARK (SYRINGE) ×3 IMPLANT
TOWEL OR 17X26 10 PK STRL BLUE (TOWEL DISPOSABLE) ×3 IMPLANT
TOWEL OR NON WOVEN STRL DISP B (DISPOSABLE) ×3 IMPLANT
TROCAR ADV FIXATION 5X100MM (TROCAR) ×3 IMPLANT
TROCAR BLADELESS 15MM (ENDOMECHANICALS) ×3 IMPLANT
TROCAR BLADELESS OPT 5 100 (ENDOMECHANICALS) ×3 IMPLANT
TUBING CONNECTING 10 (TUBING) ×2 IMPLANT
TUBING CONNECTING 10' (TUBING) ×1
TUBING ENDO SMARTCAP (MISCELLANEOUS) ×3 IMPLANT
TUBING INSUF HEATED (TUBING) ×3 IMPLANT

## 2017-11-09 NOTE — Anesthesia Procedure Notes (Signed)
Procedure Name: Intubation Date/Time: 11/09/2017 7:28 AM Performed by: Montel Clock, CRNA Pre-anesthesia Checklist: Patient identified, Emergency Drugs available, Suction available and Patient being monitored Patient Re-evaluated:Patient Re-evaluated prior to induction Oxygen Delivery Method: Circle system utilized Preoxygenation: Pre-oxygenation with 100% oxygen Induction Type: IV induction Ventilation: Oral airway inserted - appropriate to patient size and Two handed mask ventilation required Laryngoscope Size: Mac and 4 Grade View: Grade II Tube type: Oral Tube size: 7.5 mm Number of attempts: 2 Airway Equipment and Method: Stylet and Patient positioned with wedge pillow Placement Confirmation: ETT inserted through vocal cords under direct vision,  positive ETCO2 and breath sounds checked- equal and bilateral Secured at: 23 cm Tube secured with: Tape Dental Injury: Teeth and Oropharynx as per pre-operative assessment  Comments: DL attempt x1 with MAC 3, grade 3 view. Masked pt. Second DL with MAC 4 blade, grade 2 view. Successful intubation.

## 2017-11-09 NOTE — Discharge Instructions (Signed)
° ° ° °GASTRIC BYPASS/SLEEVE ° Home Care Instructions ° ° These instructions are to help you care for yourself when you go home. ° °Call: If you have any problems. °• Call [phone] and ask for the surgeon on call °• If you need immediate assistance come to the ER at [facility]. Tell the ER staff you are a new post-op gastric bypass or gastric sleeve patient  °Signs and symptoms to report: • Severe  vomiting or nausea °o If you cannot handle clear liquids for longer than 1 day, call your surgeon °• Abdominal pain which does not get better after taking your pain medication °• Fever greater than 100.4°  F and chills °• Heart rate over 100 beats a minute °• Trouble breathing °• Chest pain °• Redness,  swelling, drainage, or foul odor at incision (surgical) sites °• If your incisions open or pull apart °• Swelling or pain in calf (lower leg) °• Diarrhea (Loose bowel movements that happen often), frequent watery, uncontrolled bowel movements °• Constipation, (no bowel movements for 3 days) if this happens: °o Take Milk of Magnesia, 2 tablespoons by mouth, 3 times a day for 2 days if needed °o Stop taking Milk of Magnesia once you have had a bowel movement °o Call your doctor if constipation continues °Or °o Take Miralax  (instead of Milk of Magnesia) following the label instructions °o Stop taking Miralax once you have had a bowel movement °o Call your doctor if constipation continues °• Anything you think is “abnormal for you” °  °Normal side effects after surgery: • Unable to sleep at night or unable to concentrate °• Irritability °• Being tearful (crying) or depressed ° °These are common complaints, possibly related to your anesthesia, stress of surgery, and change in lifestyle, that usually go away a few weeks after surgery. If these feelings continue, call your medical doctor.  °Wound Care: You may have surgical glue, steri-strips, or staples over your incisions after surgery °• Surgical glue: Looks like clear  film over your incisions and will wear off a little at a time °• Steri-strips: Adhesive strips of tape over your incisions. You may notice a yellowish color on skin under the steri-strips. This is used to make the steri-strips stick better. Do not pull the steri-strips off - let them fall off °• Staples: Staples may be removed before you leave the hospital °o If you go home with staples, call [facility] for an appointment with your surgeon’s nurse to have staples removed 10 days after surgery, [phone] °• Showering: You may shower two (2) days after your surgery unless your surgeon tells you differently °o Wash gently around incisions with warm soapy water, rinse well, and gently pat dry °o If you have a drain (tube from your incision), you may need someone to hold this while you shower °o No tub baths until staples are removed and incisions are healed °  °Medications: • Medications should be liquid or crushed if larger than the size of a dime °• Extended release pills (medication that releases a little bit at a time through the  day) should not be crushed °• Depending on the size and number of medications you take, you may need to space (take a few throughout the day)/change the time you take your medications so that you do not over-fill your pouch (smaller stomach) °• Make sure you follow-up with you primary care physician to make medication changes needed during rapid weight loss and life -style changes °•   If you have diabetes, follow up with your doctor that orders your diabetes medication(s) within one week after surgery and check your blood sugar regularly ° °• Do not drive while taking narcotics (pain medications) ° °• Do not take acetaminophen (Tylenol) and Roxicet or Lortab Elixir at the same time since these pain medications contain acetaminophen °  °Diet:  °First 2 Weeks You will see the nutritionist about two (2) weeks after your surgery. The nutritionist will increase the types of  foods you can eat if you are handling liquids well: °• If you have severe vomiting or nausea and cannot handle clear liquids lasting longer than 1 day call your surgeon °Protein Shake °• Drink at least 2 ounces of shake 5-6 times per day °• Each serving of protein shakes (usually 8-12 ounces) should have a minimum of: °o 15 grams of protein °o And no more than 5 grams of carbohydrate °• Goal for protein each day: °o Men = 80 grams per day °o Women = 60 grams per day °  ° • Protein powder may be added to fluids such as non-fat milk or Lactaid milk or Soy milk (limit to 35 grams added protein powder per serving) ° °Hydration °• Slowly increase the amount of water and other clear liquids as tolerated (See Acceptable Fluids) °• Slowly increase the amount of protein shake as tolerated °• Sip fluids slowly and throughout the day °• May use sugar substitutes in small amounts (no more than 6-8 packets per day; i.e. Splenda) ° °Fluid Goal °• The first goal is to drink at least 8 ounces of protein shake/drink per day (or as directed by the nutritionist); some examples of protein shakes are Syntrax Nectar, Adkins Advantage, EAS Edge HP, and Unjury. - See handout from pre-op Bariatric Education Class: °o Slowly increase the amount of protein shake you drink as tolerated °o You may find it easier to slowly sip shakes throughout the day °o It is important to get your proteins in first °• Your fluid goal is to drink 64-100 ounces of fluid daily °o It may take a few weeks to build up to this  °• 32 oz. (or more) should be clear liquids °And °• 32 oz. (or more) should be full liquids (see below for examples) °• Liquids should not contain sugar, caffeine, or carbonation ° °Clear Liquids: °• Water of Sugar-free flavored water (i.e. Fruit H²O, Propel) °• Decaffeinated coffee or tea (sugar-free) °• Crystal lite, Wyler’s Lite, Minute Maid Lite °• Sugar-free Jell-O °• Bouillon or broth °• Sugar-free Popsicle:    - Less than 20 calories  each; Limit 1 per day ° °Full Liquids: °                  Protein Shakes/Drinks + 2 choices per day of other full liquids °• Full liquids must be: °o No More Than 12 grams of Carbs per serving °o No More Than 3 grams of Fat per serving °• Strained low-fat cream soup °• Non-Fat milk °• Fat-free Lactaid Milk °• Sugar-free yogurt (Dannon Lite & Fit, Greek yogurt) ° °  °Vitamins and Minerals • Start 1 day after surgery unless otherwise directed by your surgeon °• 2 Chewable Multivitamin / Multimineral Supplement with iron  °• Chewable Calcium Citrate with Vitamin D-3 °(Example: 3 Chewable Calcium  Plus 600 with Vitamin D-3) °o Take 500 mg three (3) times a day for a total of 1500 mg each day °o Do not take all 3 doses of calcium   at one time as it may cause constipation, and you can only absorb 500 mg at a time °o Do not mix multivitamins containing iron with calcium supplements;  take 2 hours apart °• Menstruating women and those at risk for anemia ( a blood disease that causes weakness) may need extra iron °o Talk to your doctor to see if you need more iron °• If you need extra iron: Total daily Iron recommendation (including Vitamins) is 50 to 100 mg Iron/day °• Do not stop taking or change any vitamins or minerals until you talk to your nutritionist or surgeon °• Your nutritionist and/or surgeon must approve all vitamin and mineral supplements °  °Activity and Exercise: It is important to continue walking at home. Limit your physical activity as instructed by your doctor. During this time, use these guidelines: °• Do not lift anything greater than ten  (10) pounds for at least two (2) weeks °• Do not go back to work or drive until your surgeon says you can °• You may have sex when you feel comfortable °o It is VERY important for male patients to use a reliable birth control method; fertility often increase after surgery °o Do not get pregnant for at least 18 months °• Start exercising as soon as your doctor tells  you that you can °o Make sure your doctor approves any physical activity °• Start with a simple walking program °• Walk 5-15 minutes each day, 7 days per week °• Slowly increase until you are walking 30-45 minutes per day °• Consider joining our BELT program. [phone] or email [email or web address] °  °Special Instructions Things to remember: °• Use your CPAP when sleeping if this applies to you °• Consider buying a medical alert bracelet that says you had lap-band surgery °  °  You will likely have your first fill (fluid added to your band) 6 - 8 weeks after surgery °• [facility] has a free Bariatric Surgery Support Group that meets monthly, the 3rd Thursday, 6pm. [facility] Class[facility]. You can see classes online at [email or web address] °• It is very important to keep all follow up appointments with your surgeon, nutritionist, primary care physician, and behavioral health practitioner °o After the first year, please follow up with your bariatric surgeon and nutritionist at least once a year in order to maintain best weight loss results °      °             [facility]:  [phone] ° °             [facility]: [phone] ° °             Bariatric Nurse Coordinator: [phone]  °Gastric Bypass/Sleeve Home Care Instructions  Rev. 01/2013    ° °                                                    Reviewed and Endorsed °                                                   by [facility] Patient Education Committee, Jan, 2014 ° ° ° ° ° ° ° ° ° °

## 2017-11-09 NOTE — Anesthesia Preprocedure Evaluation (Signed)
Anesthesia Evaluation  Patient identified by MRN, date of birth, ID band Patient awake    Reviewed: Allergy & Precautions, NPO status , Patient's Chart, lab work & pertinent test results, reviewed documented beta blocker date and time   Airway Mallampati: II  TM Distance: >3 FB Neck ROM: Full    Dental no notable dental hx.    Pulmonary neg pulmonary ROS, sleep apnea , former smoker,    Pulmonary exam normal breath sounds clear to auscultation       Cardiovascular hypertension, Pt. on medications and Pt. on home beta blockers negative cardio ROS Normal cardiovascular exam+ dysrhythmias Atrial Fibrillation  Rhythm:Regular Rate:Normal     Neuro/Psych Seizures -, Well Controlled,  negative neurological ROS  negative psych ROS   GI/Hepatic negative GI ROS, Neg liver ROS,   Endo/Other  negative endocrine ROSMorbid obesity  Renal/GU negative Renal ROS  negative genitourinary   Musculoskeletal negative musculoskeletal ROS (+)   Abdominal   Peds negative pediatric ROS (+)  Hematology negative hematology ROS (+)   Anesthesia Other Findings   Reproductive/Obstetrics negative OB ROS                             Anesthesia Physical Anesthesia Plan  ASA: III  Anesthesia Plan: General   Post-op Pain Management:    Induction: Intravenous  PONV Risk Score and Plan: 2 and Ondansetron and Midazolam  Airway Management Planned: Oral ETT  Additional Equipment:   Intra-op Plan:   Post-operative Plan: Extubation in OR  Informed Consent: I have reviewed the patients History and Physical, chart, labs and discussed the procedure including the risks, benefits and alternatives for the proposed anesthesia with the patient or authorized representative who has indicated his/her understanding and acceptance.   Dental advisory given  Plan Discussed with: CRNA  Anesthesia Plan Comments:          Anesthesia Quick Evaluation

## 2017-11-09 NOTE — Anesthesia Postprocedure Evaluation (Signed)
Anesthesia Post Note  Patient: Scott Macdonald  Procedure(s) Performed: LAPAROSCOPIC GASTRIC SLEEVE RESECTION WITH UPPER ENDO (N/A )     Patient location during evaluation: PACU Anesthesia Type: General Level of consciousness: awake and alert Pain management: pain level controlled Vital Signs Assessment: post-procedure vital signs reviewed and stable Respiratory status: spontaneous breathing, nonlabored ventilation and respiratory function stable Cardiovascular status: blood pressure returned to baseline and stable Postop Assessment: no apparent nausea or vomiting Anesthetic complications: no    Last Vitals:  Vitals:   11/09/17 1020 11/09/17 1043  BP:  127/83  Pulse:  78  Resp:    Temp: 36.6 C 36.7 C  SpO2:  93%    Last Pain:  Vitals:   11/09/17 1043  TempSrc: Oral  PainSc:                  Lowella CurbWarren Molzahn Brynda Heick

## 2017-11-09 NOTE — Op Note (Signed)
Operative Note  Sanjuana Kavaimothy Stay  [id number]  [id number]  11/09/2017   Surgeon: Lady Deutscherhelsea A ConnorMD  Assistant: Ovidio Kinavid Newman MD  Procedure performed: laparoscopic sleeve gastrectomy, upper endoscopy  Preop diagnosis: Morbid obesity Body mass index is 49.87 kg/m., severe obstructive sleep apnea, atrial fibrillation, seizures s/p vagus nerve stimulator placement Post-op diagnosis/intraop findings: same  Specimens: fundus Retained items: none EBL: 30cc Complications: none  Description of procedure: After obtaining informed consent and administration of prophylactic lovenox in holding, the patient was taken to the operating room and placed supine on operating room table wheregeneral endotracheal anesthesia was initiated, preoperative antibiotics were administered, SCDs applied, and a formal timeout was performed. The abdomen was prepped and draped in usual sterile fashion. Peritoneal access was gained using a Visiport technique in the left upper quadrant and insufflation to 15 mmHg ensued without issue. Gross inspection revealed no evidence of injury. Under direct visualization three more 5 mm trochars were placed in the right and left hemiabdomen and the 15mm trocar in the right paramedian upper abdomen. Bilateral laparoscopic assisted TAPS blocks were performed with Exparel diluted with 0.25 percent Marcaine with epinephrine. The patient was placed in steep Trendelenburg and the liver retractor was introduced through an incision in the upper midline and secured to the post externally to maintain the left lobe retracted anteriorly. There was no evidence of a hiatal hernia. Using the Harmonic scalpel, the greater curvature of the stomach was dissected away from the greater omentum and short gastric vessels were divided. This began 6 cm from the pylorus, and dissection proceeded until the left crus was clearly exposed. There were some filmy adhesions of the posterior stomach to the pancreas which  were taken down the Harmonic. Esophageal fat pad was mobilized off the anterior stomach slightly. The 6640 [place] VisiGi was then introduced and directed down towards the pylorus. This was placed to suction against the lesser curve. Serial fires of the linear cutting stapler with Peri-Strips were then employed to create our sleeve. The first fire used to green load and ensured adequate room at the angularis incisura. One gold load and then 3 blue loads were then employed to create a narrow tubular stomach all up to the angle of His. The excised stomach was then removed through our 15 mm trocar site within an Endo Catch bag. The visigi was taken off of suction and a few puffs of air were introduced, inflating the sleeve. No bubbles were observed and the irrigation fluid around the stomach and the shape was noted to be a nice smooth tube without any narrowing at the angularis. The visigi was then removed. Upper endoscopy was performed by the assistant surgeon and the sleeve was noted to be airtight, the staple line was hemostatic. Please see his separate note. The endoscope was removed. The 15 mm trocar site fascia in the right upper abdomen was closed with 0 Vicryl using the laparoscopic suture passer under direct visualization. The liver retractor was removed under direct visualization. The abdomen was then desufflated and all remaining trochars removed. The skin incisions were closed with running subcuticular Monocryl; benzoin, Steri-Strips and Band-Aids were applied The patient was then awakened, extubated and taken to PACU in stable condition.    All counts were correct at the completion of the case.

## 2017-11-09 NOTE — Op Note (Signed)
Name:  Scott Macdonald MRN: [id number] Date of Surgery: 11/09/2017  Preop Diagnosis:  Morbid Obesity  Postop Diagnosis:  Morbid Obesity (Weight - 381, BMI - 53.2), S/P Gastric Sleeve resection  Procedure:  Upper endoscopy  (Intraoperative)  Surgeon:  Ovidio Kinavid Megann Easterwood, M.D.  Anesthesia:  GET  Indications for procedure: Scott Macdonald is a 31 y.o. male whose primary care physician is Courtney HeysYates-Davis, Heather D, PA-C and has completed a gastric sleeve resection today for weight loss by Dr. Fredricka Bonineonnor.  I am doing an intraoperative upper endoscopy to evaluate the gastric pouch after the sleeve gastrectomy.  Operative Note: The patient is under general anesthesia.  Dr. Fredricka Bonineonnor is laparoscoping the patient while I do an upper endoscopy to evaluate the stomach pouch.  With the patient intubated, I passed the Pentax upper endoscope without difficulty down the esophagus.  The esophagus was unremarkable.  The esophago-gastric junction was at 40 cm.    The mucosa of the stomach looked viable and the staple line was intact without bleeding.  I advanced the scope to the pylorus, but did not go through it.  While I insufflated the stomach pouch with air, Dr. Fredricka Bonineonnor  flooded the upper abdomen with saline to put the gastric pouch under saline.  There was no bubbling or evidence of a leak.  There was no evidence of narrowing of the pouch and the gastric sleeve looked tubular.  The scope was then withdrawn.  The esophagus was unremarkable and the patient tolerated the endoscopy without difficulty.  Ovidio Kinavid Xerxes Agrusa, MD, [facility] Pager: [phone] Office phone:  [phone]

## 2017-11-09 NOTE — Transfer of Care (Signed)
Immediate Anesthesia Transfer of Care Note  Patient: Scott Macdonald  Procedure(s) Performed: LAPAROSCOPIC GASTRIC SLEEVE RESECTION WITH UPPER ENDO (N/A )  Patient Location: PACU  Anesthesia Type:General  Level of Consciousness: awake, alert  and oriented  Airway & Oxygen Therapy: Patient Spontanous Breathing and Patient connected to face mask  Post-op Assessment: Report given to RN and Post -op Vital signs reviewed and stable  Post vital signs: Reviewed and stable  Last Vitals:  Vitals:   11/09/17 0522  BP: (!) 144/94  Pulse: 86  Resp: (!) 22  Temp: 36.6 C  SpO2: 95%    Last Pain:  Vitals:   11/09/17 0522  TempSrc: Oral      Patients Stated Pain Goal: 4 (11/09/17 0533)  Complications: No apparent anesthesia complications

## 2017-11-09 NOTE — Interval H&P Note (Signed)
History and Physical Interval Note:  11/09/2017 7:01 AM  Scott Macdonald  has presented today for surgery, with the diagnosis of MORIBD OBESITY  The various methods of treatment have been discussed with the patient and family. After consideration of risks, benefits and other options for treatment, the patient has consented to  Procedure(s): LAPAROSCOPIC GASTRIC SLEEVE RESECTION WITH UPPER ENDO (N/A) as a surgical intervention .  The patient's history has been reviewed, patient examined, no change in status, stable for surgery.  I have reviewed the patient's chart and labs.  Questions were answered to the patient's satisfaction.    Last Xarelto was Thursday night   [name]

## 2017-11-09 NOTE — Progress Notes (Signed)
Discussed post op day goals with patient including ambulation, IS, diet progression, pain, and nausea control. Patient met goals for diet progression including ambulating 2 laps in hallway, urinating, and stable vital signs.  2 ounces of clear fluid completed without difficulty.  Only concern at this time was an incision that had some bloody drainage.  Band-Aid removed and reapplied by nursing staff.  Questions answered.

## 2017-11-10 DIAGNOSIS — R569 Unspecified convulsions: Secondary | ICD-10-CM | POA: Insufficient documentation

## 2017-11-10 DIAGNOSIS — G4733 Obstructive sleep apnea (adult) (pediatric): Secondary | ICD-10-CM | POA: Insufficient documentation

## 2017-11-10 LAB — COMPREHENSIVE METABOLIC PANEL
ALBUMIN: 3.3 g/dL — AB (ref 3.5–5.0)
ALT: 175 U/L — ABNORMAL HIGH (ref 17–63)
ANION GAP: 6 (ref 5–15)
AST: 122 U/L — AB (ref 15–41)
Alkaline Phosphatase: 31 U/L — ABNORMAL LOW (ref 38–126)
BUN: 16 mg/dL (ref 6–20)
CHLORIDE: 104 mmol/L (ref 101–111)
CO2: 27 mmol/L (ref 22–32)
Calcium: 7.8 mg/dL — ABNORMAL LOW (ref 8.9–10.3)
Creatinine, Ser: 0.93 mg/dL (ref 0.61–1.24)
GFR calc Af Amer: >60 mL/min (ref >60)
GFR calc non Af Amer: >60 mL/min (ref >60)
GLUCOSE: 85 mg/dL (ref 65–99)
POTASSIUM: 4.3 mmol/L (ref 3.5–5.1)
SODIUM: 137 mmol/L (ref 135–145)
TOTAL PROTEIN: 6 g/dL — AB (ref 6.5–8.1)
Total Bilirubin: 1 mg/dL (ref 0.3–1.2)

## 2017-11-10 LAB — CBC WITH DIFFERENTIAL/PLATELET
BASOS ABS: 0 10*3/uL (ref 0.0–0.1)
BASOS PCT: 0 %
EOS ABS: 0 10*3/uL (ref 0.0–0.7)
EOS PCT: 1 %
HCT: 39 % (ref 39.0–52.0)
Hemoglobin: 13 g/dL (ref 13.0–17.0)
Lymphocytes Relative: 27 %
Lymphs Abs: 1.5 10*3/uL (ref 0.7–4.0)
MCH: 30.6 pg (ref 26.0–34.0)
MCHC: 33.3 g/dL (ref 30.0–36.0)
MCV: 91.8 fL (ref 78.0–100.0)
MONO ABS: 0.9 10*3/uL (ref 0.1–1.0)
MONOS PCT: 17 %
Neutro Abs: 3 10*3/uL (ref 1.7–7.7)
Neutrophils Relative %: 55 %
PLATELETS: 155 10*3/uL (ref 150–400)
RBC: 4.25 MIL/uL (ref 4.22–5.81)
RDW: 13.3 % (ref 11.5–15.5)
WBC: 5.4 10*3/uL (ref 4.0–10.5)

## 2017-11-10 MED ORDER — OXYCODONE HCL 5 MG/5ML PO SOLN: 5.0000 mg | ORAL | 0 refills | Status: DC | PRN

## 2017-11-10 MED ORDER — DIVALPROEX SODIUM 125 MG PO CSDR: 500.0000 mg | DELAYED_RELEASE_CAPSULE | Freq: Four times a day (QID) | ORAL | 0 refills | Status: DC

## 2017-11-10 MED ORDER — ONDANSETRON 4 MG PO TBDP: 4.0000 mg | ORAL_TABLET | Freq: Three times a day (TID) | ORAL | 0 refills | Status: DC | PRN

## 2017-11-10 MED ORDER — PANTOPRAZOLE SODIUM 40 MG PO TBEC: 40.0000 mg | DELAYED_RELEASE_TABLET | Freq: Every day | ORAL | 2 refills | Status: DC

## 2017-11-10 NOTE — Progress Notes (Signed)
Patient alert and oriented, pain is controlled. Patient is tolerating fluids, advanced to protein shake today, patient is tolerating well.  Reviewed Gastric sleeve discharge instructions with patient and patient is able to articulate understanding.  Provided information on BELT program, Support Group and [facility] outpatient pharmacy. All questions answered, will continue to monitor.  

## 2017-11-10 NOTE — Plan of Care (Signed)
Nutrition Education Note  Received consult for diet education per DROP protocol. Patient needs to get supply of vitamins. Reviewed vitamin/mineral options and where to buy them.  Discussed 2 week post op diet with pt. Emphasized that liquids must be non carbonated, non caffeinated, and sugar free. Fluid goals discussed. Pt to follow up with outpatient bariatric RD for further diet progression after 2 weeks. Multivitamins and minerals also reviewed. Teach back method used, pt expressed understanding, expect good compliance.   Diet: First 2 Weeks  You will see the nutritionist about two (2) weeks after your surgery. The nutritionist will increase the types of foods you can eat if you are handling liquids well:  If you have severe vomiting or nausea and cannot handle clear liquids lasting longer than 1 day, call your surgeon  Protein Shake  Drink at least 2 ounces of shake 5-6 times per day  Each serving of protein shakes (usually 8 - 12 ounces) should have a minimum of:  15 grams of protein  And no more than 5 grams of carbohydrate  Goal for protein each day:  Men = 80 grams per day  Women = 60 grams per day  Protein powder may be added to fluids such as non-fat milk or Lactaid milk or Soy milk (limit to 35 grams added protein powder per serving)   Hydration  Slowly increase the amount of water and other clear liquids as tolerated (See Acceptable Fluids)  Slowly increase the amount of protein shake as tolerated  Sip fluids slowly and throughout the day  May use sugar substitutes in small amounts (no more than 6 - 8 packets per day; i.e. Splenda)   Fluid Goal  The first goal is to drink at least 8 ounces of protein shake/drink per day (or as directed by the nutritionist); some examples of protein shakes are Premier Protein, ITT IndustriesSyntrax Nectar, Dillard'sdkins Advantage, EAS Edge HP, and Unjury. See handout from pre-op Bariatric Education Class:  Slowly increase the amount of protein shake you drink as  tolerated  You may find it easier to slowly sip shakes throughout the day  It is important to get your proteins in first  Your fluid goal is to drink 64 - 100 ounces of fluid daily  It may take a few weeks to build up to this  32 oz (or more) should be clear liquids  And  32 oz (or more) should be full liquids (see below for examples)  Liquids should not contain sugar, caffeine, or carbonation   Clear Liquids:  Water or Sugar-free flavored water (i.e. Fruit H2O, Propel)  Decaffeinated coffee or tea (sugar-free)  Crystal Lite, Wyler?s Lite, Minute Maid Lite  Sugar-free Jell-O  Bouillon or broth  Sugar-free Popsicle: *Less than 20 calories each; Limit 1 per day   Full Liquids:  Protein Shakes/Drinks + 2 choices per day of other full liquids  Full liquids must be:  No More Than 12 grams of Carbs per serving  No More Than 3 grams of Fat per serving  Strained low-fat cream soup  Non-Fat milk  Fat-free Lactaid Milk  Sugar-free yogurt (Dannon Lite & Fit, [place] yogurt, Oikos Zero)   Scott FrancoLindsey Aaliyha Mumford, MS, RD, LDN [facility] Clinical Dietitian Pager: [phone] After Hours Pager: [phone]

## 2017-11-10 NOTE — Progress Notes (Signed)
Patient alert and oriented, Post op day 1.  Provided support and encouragement.  Encouraged pulmonary toilet, ambulation and small sips of liquids.  Completed 12 ounces of clear fluid completed, started protein shakes this am.  Denies nausea, received oral pain medication. Passing flatus. All questions answered.  Will continue to monitor.

## 2017-11-10 NOTE — Progress Notes (Signed)
S: Slept well overnight. Upper abdominal pain relieved by meds; now some mild lower abdominal pain. Passing flatus. No nausea, reflux or dysphagia. Has done well with clears and protein shakes.   Vitals, labs, intake/output, and orders reviewed at this time. Tmax 99; HR 68, normotensive, sats mid 90s room air. CMP with expected mild transaminitis from liver retraction, otherwise normal. WBC 5.4, Hgb 13 (13.7 yesterday)  Gen: A&Ox3, no distress  H&N: EOMI, atraumatic, neck supple Chest: unlabored respirations, RRR Abd: soft, appropriately tender, nondistended, incisions c/d/i with steris. subxyphoid incision with prior bleeding saturating steris- these are being replaced. No other ecchymosis or hematoma present Ext: warm, no edema Neuro: grossly normal  Lines/tubes/drains: PIV  A/P:  POD 1 laparoscopic sleeve gastrectomy, doing well -Continue clears and protein shakes as tolerated -Ambulate -Pulmonary toilet -Plan discharge today   Phylliss Blakeshelsea Venice Marcucci, MD [facility], [place] Pager [phone]

## 2017-11-10 NOTE — Progress Notes (Signed)
Assessment unchanged. Pt verbalized understanding of dc instructions through teach back including follow up care, when to call the MD, as well as all bariatric teaching given by Corene Corneaawn Williams-James, RN earlier this shift. Scripts given x 4 as provided by MD. discharged via wc to front entrance accompanied by wife and NT.

## 2017-11-10 NOTE — Discharge Summary (Signed)
Physician Discharge Summary  Toni Hoffmeister WIO:[id number] DOB: 12-11-1986 DOA: 11/09/2017  PCP: Reyes Ivan, PA-C  Admit date: 11/09/2017 Discharge date: 11/10/2017  Recommendations for Outpatient Follow-up:  1. PCP to titrate medications (include homehealth, outpatient follow-up instructions, specific recommendations for PCP to follow-up on, etc.)  Follow-up Information    Clovis Riley, MD. Go on 11/25/2017.   Specialty:  General Surgery Why:  at 415pm Contact information: [phone]        Clovis Riley, MD Follow up.   Specialty:  General Surgery Contact information: [phone]          Discharge Diagnoses:  Active Problems:   Morbid obesity ([place])   Surgical Procedure: Laparoscopic Sleeve Gastrectomy, upper endoscopy  Discharge Condition: Good Disposition: Home  Diet recommendation: Postoperative sleeve gastrectomy diet (liquids only)  Filed Weights   11/09/17 0522 11/10/17 0637  Weight: (!) 171.5 kg (378 lb) (!) 173 kg (381 lb 6.3 oz)     Hospital Course:  The patient was admitted for a planned laparoscopic sleeve gastrectomy. Please see operative note. Preoperatively the patient was given lovenox for DVT prophylaxis. Postoperative prophylactic Lovenox dosing was started on the evening of postoperative day 0. ERAS protocol was used. On the evening of postoperative day 0, the patient was started on water and ice chips. On postoperative day 1 the patient had no fever or tachycardia and was tolerating water in their diet was gradually advanced throughout the day. The patient was ambulating without difficulty. Their vital signs are stable without fever or tachycardia. Their hemoglobin had remained stable. The patient was maintained on their home settings for CPAP therapy. The patient had received discharge instructions and counseling. They were deemed stable for discharge and had met discharge criteria   Discharge Instructions  Discharge  Instructions    Ambulate hourly while awake   Complete by:  As directed    Call MD for:  difficulty breathing, headache or visual disturbances   Complete by:  As directed    Call MD for:  persistant dizziness or light-headedness   Complete by:  As directed    Call MD for:  persistant nausea and vomiting   Complete by:  As directed    Call MD for:  redness, tenderness, or signs of infection (pain, swelling, redness, odor or green/yellow discharge around incision site)   Complete by:  As directed    Call MD for:  severe uncontrolled pain   Complete by:  As directed    Call MD for:  temperature >101 F   Complete by:  As directed    Incentive spirometry   Complete by:  As directed    Perform hourly while awake     Allergies as of 11/10/2017      Reactions   Lorazepam Itching   Prednisone Other (See Comments)   Pt states it makes his seizures worse.      Medication List    STOP taking these medications   divalproex 500 MG DR tablet Commonly known as:  DEPAKOTE Replaced by:  divalproex 125 MG capsule     TAKE these medications   divalproex 125 MG capsule Commonly known as:  DEPAKOTE SPRINKLE Take 4 capsules (500 mg total) by mouth every 6 (six) hours. Replaces:  divalproex 500 MG DR tablet Notes to patient:  Instead of the extended release tablet for the first few weeks after surgery.    flecainide 100 MG tablet Commonly known as:  TAMBOCOR Take 100 mg 2 (two) times daily  by mouth.   lisinopril 10 MG tablet Commonly known as:  PRINIVIL,ZESTRIL Take 25 mg by mouth daily. Notes to patient:  Monitor Blood Pressure Daily and keep a log for primary care physician.  You may need to make changes to your medications with rapid weight loss.     loratadine 10 MG tablet Commonly known as:  CLARITIN Take 10 mg by mouth daily.   metoprolol succinate 100 MG 24 hr tablet Commonly known as:  TOPROL-XL Take 50 mg by mouth daily. Take with or immediately following a meal. Notes to  patient:  Monitor Blood Pressure Daily and keep a log for primary care physician.  You may need to make changes to your medications with rapid weight loss.     ondansetron 4 MG disintegrating tablet Commonly known as:  ZOFRAN-ODT Take 1 tablet (4 mg total) by mouth every 8 (eight) hours as needed for nausea or vomiting.   oxyCODONE 5 MG/5ML solution Commonly known as:  ROXICODONE Take 5-10 mLs (5-10 mg total) by mouth every 4 (four) hours as needed for moderate pain or severe pain (please offer tylenol first).   pantoprazole 40 MG tablet Commonly known as:  PROTONIX Take 1 tablet (40 mg total) by mouth daily.   PARoxetine 10 MG tablet Commonly known as:  PAXIL Take 10 mg by mouth daily.   rivaroxaban 20 MG Tabs tablet Commonly known as:  XARELTO Take 1 tablet (20 mg total) by mouth daily with supper. Notes to patient:  Resume this on 11/11/17      Follow-up Information    Clovis Riley, MD. Go on 11/25/2017.   Specialty:  General Surgery Why:  at 415pm Contact information: [phone]        Clovis Riley, MD Follow up.   Specialty:  General Surgery Contact information: [phone]            The results of significant diagnostics from this hospitalization (including imaging, microbiology, ancillary and laboratory) are listed below for reference.    Significant Diagnostic Studies: Dg Chest 2 View  Result Date: 10/12/2017 CLINICAL DATA:  Preoperative study for planned sleeve gastrectomy. EXAM: CHEST  2 VIEW COMPARISON:  None. FINDINGS: The cardiomediastinal silhouette is normal in size. Normal pulmonary vascularity. No focal consolidation, pleural effusion, or pneumothorax. No acute osseous abnormality. Vagal stimulator device noted in the left anterior chest wall. IMPRESSION: No active cardiopulmonary disease. Electronically Signed   By: Titus Dubin M.D.   On: 10/12/2017 11:57   Dg Ugi  W/kub  Result Date: 10/12/2017 CLINICAL DATA:  Morbid  obesity. EXAM: UPPER GI SERIES WITH KUB TECHNIQUE: After obtaining a scout radiograph a routine upper GI series was performed using thin density barium. FLUOROSCOPY TIME:  Fluoroscopy Time:  1 minutes 24 seconds Radiation Exposure Index (if provided by the fluoroscopic device): 87.5 mGy COMPARISON:  None. FINDINGS: The KUB demonstrates a normal bowel gas pattern. The mucosa and motility of the esophagus are normal. No hiatal hernia. The fundus, body, and antrum of the stomach are normal. The pylorus and duodenal bulb and C-loop appear normal. IMPRESSION: Normal upper GI. Electronically Signed   By: Lorriane Shire M.D.   On: 10/12/2017 11:23    Labs: Basic Metabolic Panel: Recent Labs  Lab 11/10/17 0502  NA 137  K 4.3  CL 104  CO2 27  GLUCOSE 85  BUN 16  CREATININE 0.93  CALCIUM 7.8*   Liver Function Tests: Recent Labs  Lab 11/10/17 0502  AST 122*  ALT 175*  ALKPHOS 31*  BILITOT 1.0  PROT 6.0*  ALBUMIN 3.3*    CBC: Recent Labs  Lab 11/09/17 1128 11/10/17 0502  WBC  --  5.4  NEUTROABS  --  3.0  HGB 13.7 13.0  HCT 41.6 39.0  MCV  --  91.8  PLT  --  155    CBG: No results for input(s): GLUCAP in the last 168 hours.  Active Problems:   Morbid obesity [facility])    Signed:  [facility], [place] [phone] 11/10/2017, 10:38 AM

## 2017-11-16 ENCOUNTER — Telehealth (HOSPITAL_COMMUNITY): Payer: Self-pay

## 2017-11-16 NOTE — Telephone Encounter (Signed)
Follow up with bariatric surgical patient to discuss post discharge questions.  No answer at this time voicemail left for patient along with contact information to discuss the following questions.  1.  Are you having any pain not relieved by pain medication?no pain medication works, is having some pain in upper thigh not uncommon due to slipped disc  2.  How much fluid total fluid intake have you had in the last 24/48 hours?  50 ounces total  3.  How much protein intake have you had in the last 24/48 hours?80-90 grams of protein  4.  Have you had any trouble making urine?no  5.  Have you had nausea that has not been relieved by nausea medication?no used nausea medication once at Thanksgiving  6.  Are you ambulating every hour?yes  7.  Are you passing gas or had a BM?has had liquid bms.  States those are slowing down.  Discussed when to call surgeon office related to frequent diarrea  8.  Do you know how to contact [facility]? [facility]? [facility]?yes  9.  Are you taking your vitamins and calcium without difficulty?patient is not taking vitamins or calcium.  He has ordered the vitamins.  We discussed the importance of both and deficiencies.  States understanding  10. Tell me how your incision looks?  Any redness, open incision, or drainage?look good steri's are off.  States he things he can see suture, but not red oozing or painful+   We discussed baritastic app to help patient manage home medications including seizure meds and vitamins.  Explained to patient how to download app and set timers for med/vitamin, logs water, food, and exercise.  Patient states understanding

## 2017-11-20 ENCOUNTER — Telehealth: Payer: Self-pay | Admitting: Registered"

## 2017-11-20 NOTE — Telephone Encounter (Signed)
Pt called and left message with questions about his diet.  RD returned call to pt and left voicemail for him to return the call.

## 2017-11-24 ENCOUNTER — Encounter: Payer: BC Managed Care – PPO | Attending: Surgery | Admitting: Registered"

## 2017-11-24 DIAGNOSIS — Z713 Dietary counseling and surveillance: Secondary | ICD-10-CM | POA: Insufficient documentation

## 2017-11-24 DIAGNOSIS — E669 Obesity, unspecified: Secondary | ICD-10-CM

## 2017-11-26 NOTE — Progress Notes (Signed)
Bariatric Class:  Appt start time: 1530 end time:  1630.  2 Week Post-Operative Nutrition Class  Patient was seen on 11/24/2017 for Post-Operative Nutrition education at [facility].   Surgery date: 11/09/2017 Surgery type: Sleeve gastrectomy Start weight at [facility]: 367.3 Weight today: 346.2 Weight change: 21.1 lbs loss  Pt states he experiences some abdominal pain and nausea and vomiting once when he ate scrambled eggs too fast. Pt states is not getting enough fluids.   TANITA  BODY COMP RESULTS  11/24/2017   BMI (kg/m^2) 45.7   Fat Mass (lbs) 146.8   Fat Free Mass (lbs) 199.4   Total Body Water (lbs) 153.6   The following the learning objectives were met by the patient during this course:  Identifies Phase 3A (Soft, High Proteins) Dietary Goals and will begin from 2 weeks post-operatively to 2 months post-operatively  Identifies appropriate sources of fluids and proteins   States protein recommendations and appropriate sources post-operatively  Identifies the need for appropriate texture modifications, mastication, and bite sizes when consuming solids  Identifies appropriate multivitamin and calcium sources post-operatively  Describes the need for physical activity post-operatively and will follow MD recommendations  States when to call healthcare provider regarding medication questions or post-operative complications  Handouts given during class include:  Phase 3A: Soft, High Protein Diet Handout  Follow-Up Plan: Patient will follow-up at [facility] in 6 weeks for 2 month post-op nutrition visit for diet advancement per MD.

## 2017-11-29 ENCOUNTER — Other Ambulatory Visit: Payer: Self-pay | Admitting: Cardiology

## 2017-11-29 DIAGNOSIS — Z01810 Encounter for preprocedural cardiovascular examination: Secondary | ICD-10-CM

## 2017-11-30 ENCOUNTER — Ambulatory Visit: Payer: BC Managed Care – PPO | Admitting: Cardiology

## 2017-12-30 ENCOUNTER — Telehealth: Payer: Self-pay | Admitting: *Deleted

## 2017-12-30 NOTE — Telephone Encounter (Signed)
Received request for Medical records from [place] [employer], forwarded to [place] for email/scan/SLS 01/09     

## 2018-01-05 ENCOUNTER — Ambulatory Visit: Payer: Self-pay | Admitting: Registered"

## 2018-01-06 ENCOUNTER — Ambulatory Visit: Payer: Self-pay | Admitting: Registered"

## 2018-01-15 ENCOUNTER — Encounter: Payer: Self-pay | Admitting: Registered"

## 2018-01-15 ENCOUNTER — Encounter: Payer: BC Managed Care – PPO | Attending: Surgery | Admitting: Registered"

## 2018-01-15 DIAGNOSIS — E669 Obesity, unspecified: Secondary | ICD-10-CM

## 2018-01-15 DIAGNOSIS — Z713 Dietary counseling and surveillance: Secondary | ICD-10-CM | POA: Insufficient documentation

## 2018-01-15 NOTE — Progress Notes (Signed)
Follow-up visit:  8 Weeks Post-Operative Sleeve gastrectomy Surgery  Medical Nutrition Therapy:  Appt start time: 8:30 end time:  9:02.  Primary concerns today: Post-operative Bariatric Surgery Nutrition Management.  Non scale victories: none stated  Surgery date: 11/09/2017 Surgery type: Sleeve gastrectomy [facility] Weight today: 310.6 lbs Weight change: 35.6 lbs loss from 346.2 (11/24/2017) Total weight lost: 56.7 lbs Weight loss goal: to lose 70-80 more lbs;  240-250 lbs  TANITA  BODY COMP RESULTS  11/24/2017 01/15/2018   BMI (kg/m^2) 45.7 42.1   Fat Mass (lbs) 146.8 121.2   Fat Free Mass (lbs) 199.4 189.4   Total Body Water (lbs) 153.6 142.0    Pt states things are going well. Pt states he knows when he has eaten enough; tolerates a lot of food. 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 Pt states he sometimes forgets to take multivitamin, doing well with taking 3 calcium daily.    Preferred Learning Style:   No preference indicated   Learning Readiness:   Ready  Change in progress  24-hr recall: B (AM): egg-wiches (14g) Snk (AM): sometimes protein bar (15g) L (PM): 2 oz burger patty (14g) with cheese (7g) or meatloaf, 1 chicken wing Snk (PM): 2 chicken wings (18g)  D (PM): 2 chicken wings (18g) Snk (PM): none  Fluid intake: G2, powerade zero, water with flavor packs (3-4 30-oz container; 90-120 oz) Estimated total protein intake: ~86 grams  Medications: See list; no longer taking lisinopril Supplementation: [employer] chewable + 3 calcium supplements  Using straws: no Drinking while eating: not really Having you been chewing well:yes  Chewing/swallowing difficulties: no Changes in vision: sometimes blurry Changes to mood/headaches: no, no Hair loss/Changes to skin/Changes to nails: no, no, no Any difficulty focusing or concentrating: no Sweating: no Dizziness/Lightheaded: once,  due to not taking seizure medication Palpitations: no  Carbonated beverages: no N/V/D/C/GAS: N/V when eating too much/too fast, no, yes, no Abdominal Pain: no Dumping syndrome: no Last Lap-Band fill: N/A  Recent physical activity:  Work, taking stairs at work, heavy lifting at work; 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  Handouts given during visit include:  Phase 3B: High Protein + NS vegetables   Nutritional Diagnosis:  [address]-3.3 Overweight/obesity related to past poor dietary habits and physical inactivity as evidenced by patient w/ recent sleeve gastrectomy surgery following dietary guidelines for continued weight loss.     Intervention:  Nutrition education and counseling. Goals:  Follow Phase 3B: High Protein + Non-Starchy Vegetables  Eat 3-6 small meals/snacks, every 3-5 hrs  Increase lean protein foods to meet 80g goal  Increase fluid intake to 64oz +  Avoid drinking 15 minutes before, during and 30 minutes after eating  Aim for >30 min of physical activity daily  - Take multivitamin before bed.   Teaching Method Utilized:  Visual Auditory Hands on  Barriers to learning/adherence to lifestyle change: none identified   Demonstrated degree of understanding via:  Teach Back   Monitoring/Evaluation:  Dietary intake, exercise, lap band fills, and body weight. Follow up in 4 months for 6 month post-op visit.

## 2018-01-15 NOTE — Patient Instructions (Addendum)
Goals:  Follow Phase 3B: High Protein + Non-Starchy Vegetables  Eat 3-6 small meals/snacks, every 3-5 hrs  Increase lean protein foods to meet 80g goal  Increase fluid intake to 64oz +  Avoid drinking 15 minutes before, during and 30 minutes after eating  Aim for >30 min of physical activity daily  - Take multivitamin before bed.

## 2018-05-21 ENCOUNTER — Ambulatory Visit: Payer: Self-pay | Admitting: Registered"

## 2018-08-20 IMAGING — DX DG CHEST 2V
2 series · 2 of 2 positions shown · non-contrast
Comparison: None.

CLINICAL DATA: Preoperative study for planned sleeve gastrectomy.

EXAM:
CHEST  2 VIEW

[chest pa]
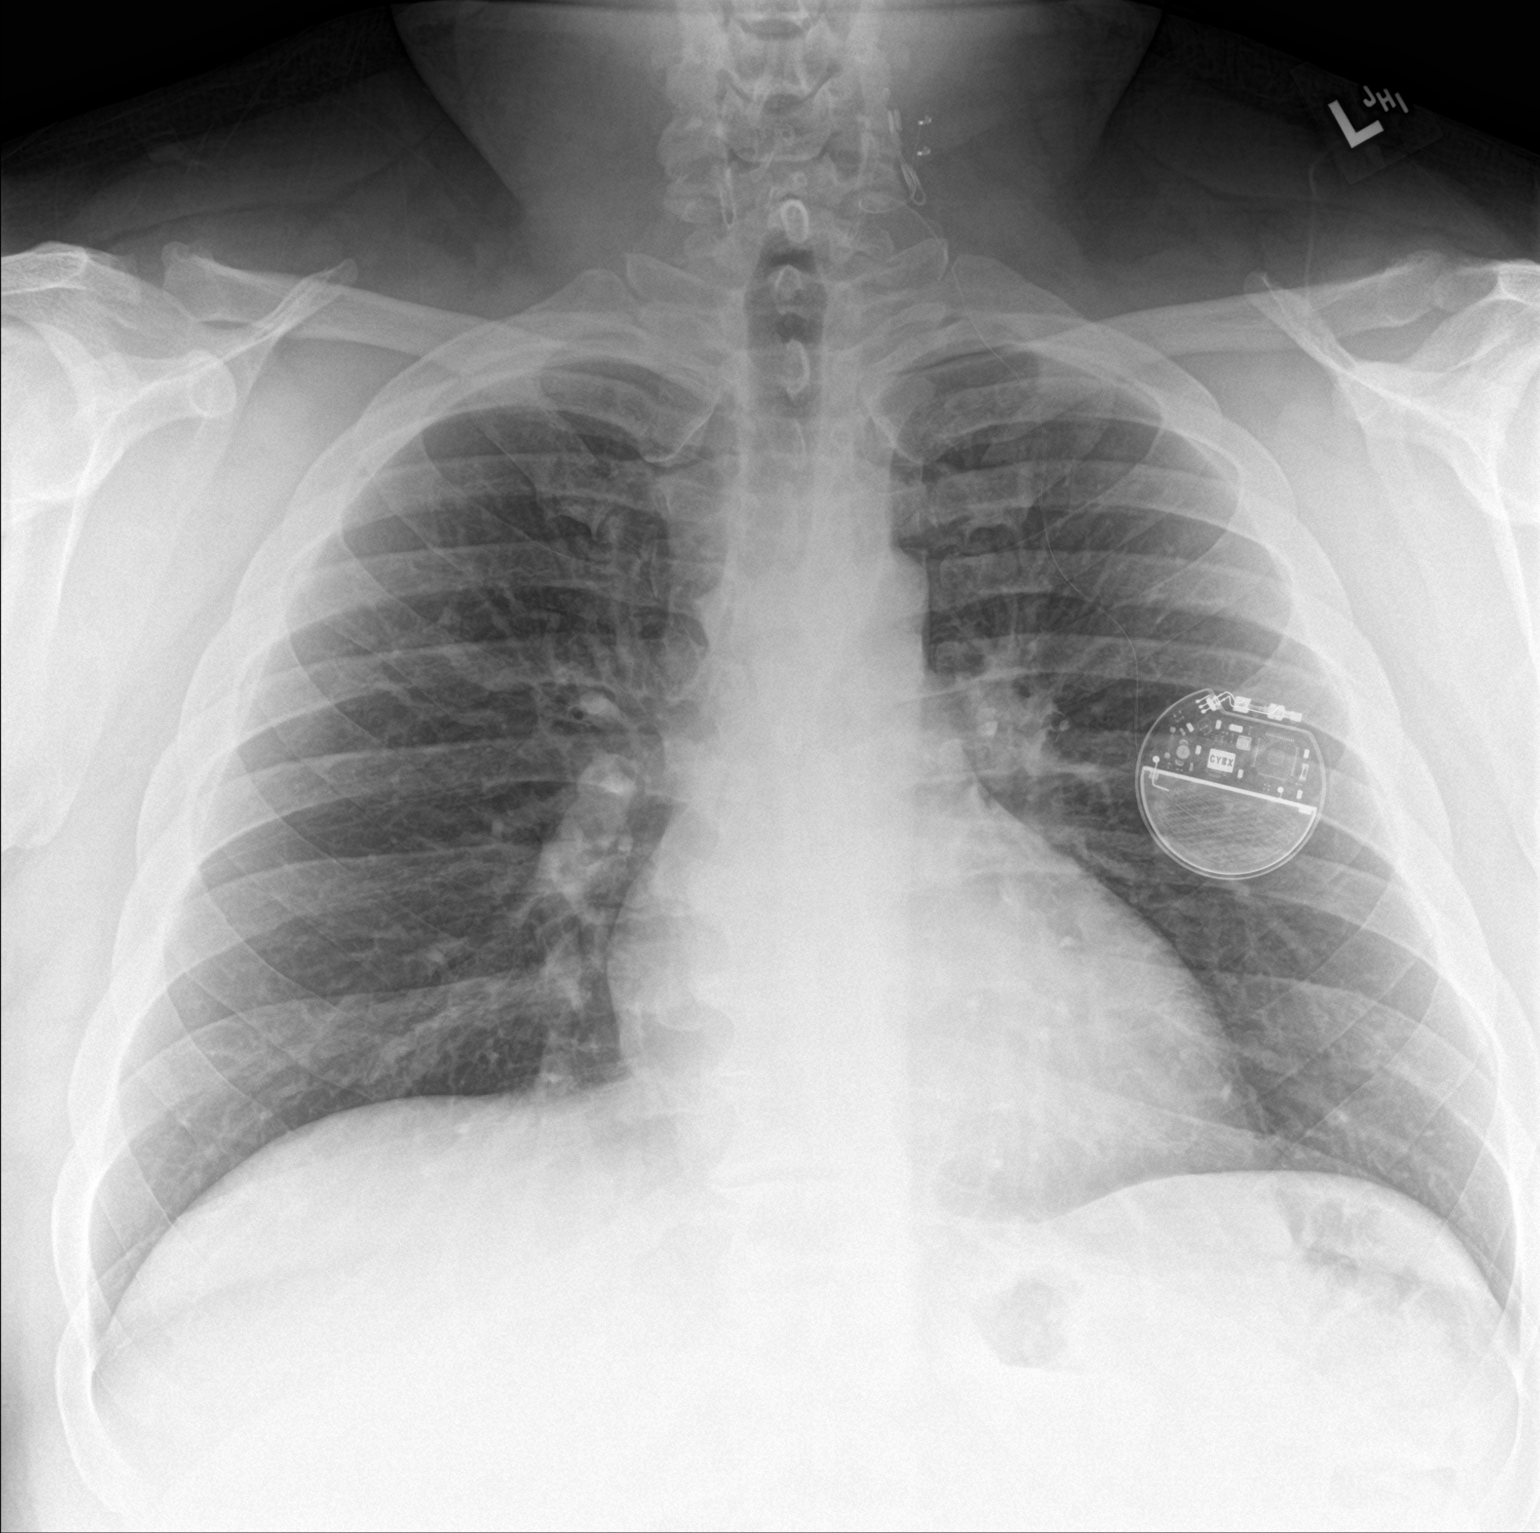

[chest lat]
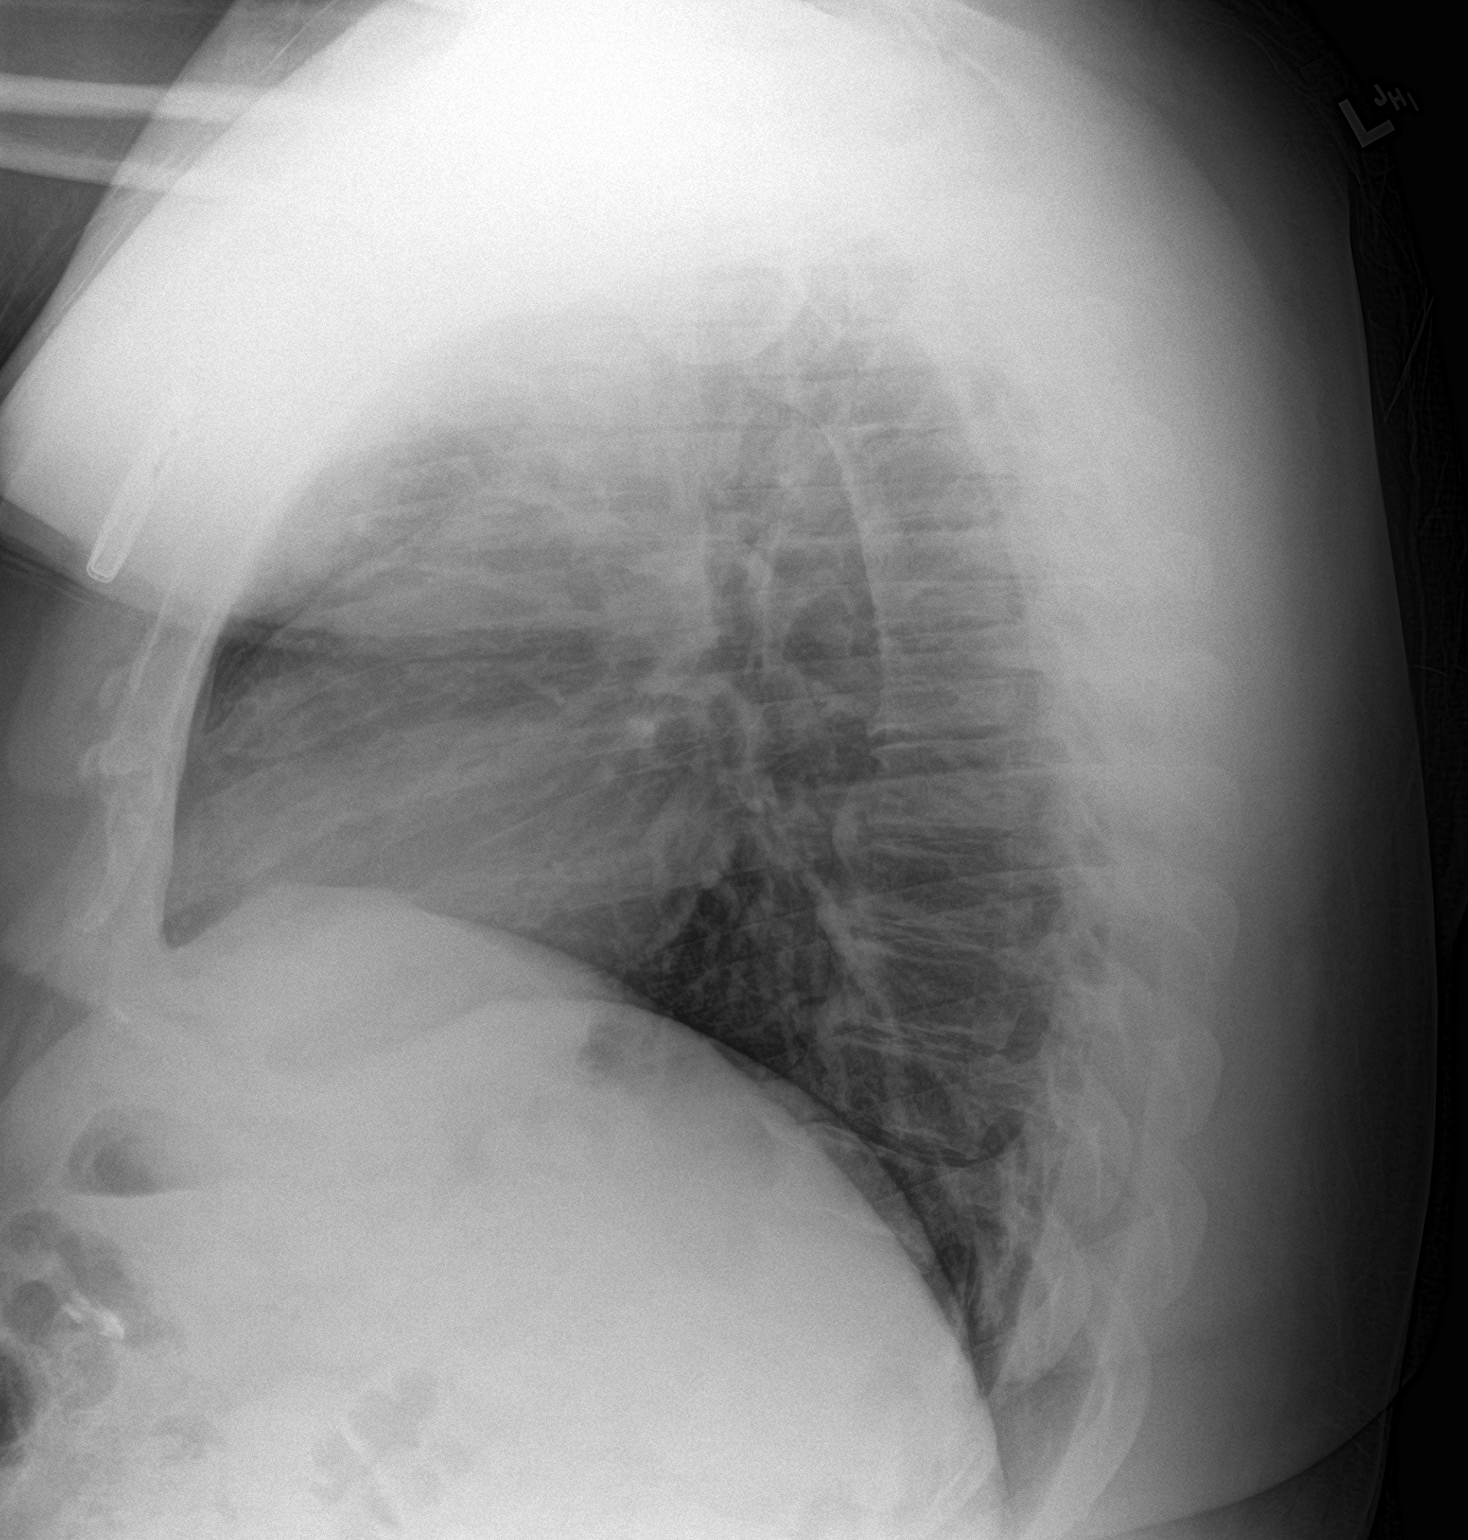

[2 of 2 positions shown; findings below may reference images not displayed]

FINDINGS: The cardiomediastinal silhouette is normal in size. Normal pulmonary
vascularity. No focal consolidation, pleural effusion, or
pneumothorax. No acute osseous abnormality. Vagal stimulator device
noted in the left anterior chest wall.
IMPRESSION: No active cardiopulmonary disease.

## 2019-07-13 ENCOUNTER — Encounter (HOSPITAL_COMMUNITY): Payer: Self-pay

## 2020-05-30 ENCOUNTER — Encounter (HOSPITAL_COMMUNITY): Payer: Self-pay

## 2021-05-28 ENCOUNTER — Encounter (HOSPITAL_COMMUNITY): Payer: Self-pay | Admitting: *Deleted

## 2022-03-06 ENCOUNTER — Other Ambulatory Visit: Payer: Self-pay | Admitting: Neurosurgery

## 2022-03-17 ENCOUNTER — Other Ambulatory Visit: Payer: Self-pay | Admitting: Neurosurgery

## 2022-03-18 ENCOUNTER — Encounter (HOSPITAL_COMMUNITY): Payer: Self-pay | Admitting: Neurosurgery

## 2022-03-18 ENCOUNTER — Other Ambulatory Visit: Payer: Self-pay

## 2022-03-18 NOTE — Progress Notes (Signed)
Pt states the first time he had a seizure, he was originally thought to have A-fib. He was on medication for A-fib for about 7 months, but then they realized it was due to seizure activity. Pt states he has had gastric sleeve surgery and has lost over 100 pounds. Only on medication for seizures and allergies. Pt states he is not diabetic.  ? ?Shower instructions given to pt. ?

## 2022-03-18 NOTE — Anesthesia Preprocedure Evaluation (Addendum)
Anesthesia Evaluation  ?Patient identified by MRN, date of birth, ID band ?Patient awake ? ? ? ?Reviewed: ?Allergy & Precautions, NPO status , Patient's Chart, lab work & pertinent test results, reviewed documented beta blocker date and time  ? ?History of Anesthesia Complications ?Negative for: history of anesthetic complications ? ?Airway ?Mallampati: II ? ?TM Distance: >3 FB ?Neck ROM: Full ? ? ? Dental ?no notable dental hx. ?(+) Dental Advisory Given ?  ?Pulmonary ?neg pulmonary ROS, former smoker,  ?  ?Pulmonary exam normal ? ? ? ? ? ? ? Cardiovascular ?hypertension, Normal cardiovascular exam+ dysrhythmias Atrial Fibrillation  ? ? ?  ?Neuro/Psych ?Seizures -,  negative psych ROS  ? GI/Hepatic ?negative GI ROS, Neg liver ROS,   ?Endo/Other  ?negative endocrine ROS ? Renal/GU ?negative Renal ROS  ? ?  ?Musculoskeletal ?negative musculoskeletal ROS ?(+)  ? Abdominal ?  ?Peds ? Hematology ?negative hematology ROS ?(+)   ?Anesthesia Other Findings ? ? Reproductive/Obstetrics ? ?  ? ? ? ? ? ? ? ? ? ? ? ? ? ?  ?  ? ? ? ? ? ? ? ?Anesthesia Physical ?Anesthesia Plan ? ?ASA: 3 ? ?Anesthesia Plan: General  ? ?Post-op Pain Management: Celebrex PO (pre-op)* and Tylenol PO (pre-op)*  ? ?Induction:  ? ?PONV Risk Score and Plan: 3 and Ondansetron, Dexamethasone, Midazolam and Treatment may vary due to age or medical condition ? ?Airway Management Planned: Oral ETT and LMA ? ?Additional Equipment:  ? ?Intra-op Plan:  ? ?Post-operative Plan: Extubation in OR ? ?Informed Consent: I have reviewed the patients History and Physical, chart, labs and discussed the procedure including the risks, benefits and alternatives for the proposed anesthesia with the patient or authorized representative who has indicated his/her understanding and acceptance.  ? ? ? ?Dental advisory given ? ?Plan Discussed with: Anesthesiologist and CRNA ? ?Anesthesia Plan Comments:   ? ? ? ? ?Anesthesia Quick Evaluation ? ?

## 2022-03-19 ENCOUNTER — Encounter (HOSPITAL_COMMUNITY): Payer: Self-pay | Admitting: Neurosurgery

## 2022-03-19 ENCOUNTER — Encounter (HOSPITAL_COMMUNITY)
Admission: RE | Disposition: A | Discharge status: Home / Self Care | Payer: Self-pay | Source: Home / Self Care | Attending: Neurosurgery

## 2022-03-19 ENCOUNTER — Other Ambulatory Visit: Payer: Self-pay

## 2022-03-19 ENCOUNTER — Ambulatory Visit (HOSPITAL_BASED_OUTPATIENT_CLINIC_OR_DEPARTMENT_OTHER): Payer: Medicaid Other | Admitting: Certified Registered Nurse Anesthetist

## 2022-03-19 ENCOUNTER — Ambulatory Visit (HOSPITAL_COMMUNITY): Payer: Medicaid Other | Admitting: Certified Registered Nurse Anesthetist

## 2022-03-19 ENCOUNTER — Ambulatory Visit (HOSPITAL_COMMUNITY)
Admission: RE | Admit: 2022-03-19 | Discharge: 2022-03-19 | Disposition: A | Discharge status: Home / Self Care | Payer: Medicaid Other | Attending: Neurosurgery | Admitting: Neurosurgery

## 2022-03-19 DIAGNOSIS — I10 Essential (primary) hypertension: Secondary | ICD-10-CM | POA: Insufficient documentation

## 2022-03-19 DIAGNOSIS — Z4542 Encounter for adjustment and management of neuropacemaker (brain) (peripheral nerve) (spinal cord): Secondary | ICD-10-CM

## 2022-03-19 DIAGNOSIS — Z87891 Personal history of nicotine dependence: Secondary | ICD-10-CM | POA: Diagnosis not present

## 2022-03-19 DIAGNOSIS — G40919 Epilepsy, unspecified, intractable, without status epilepticus: Secondary | ICD-10-CM | POA: Diagnosis not present

## 2022-03-19 DIAGNOSIS — I4891 Unspecified atrial fibrillation: Secondary | ICD-10-CM

## 2022-03-19 HISTORY — PX: VAGUS NERVE STIMULATOR INSERTION: SHX348

## 2022-03-19 HISTORY — DX: Other complications of anesthesia, initial encounter: T88.59XA

## 2022-03-19 HISTORY — DX: Anemia, unspecified: D64.9

## 2022-03-19 HISTORY — DX: Attention-deficit hyperactivity disorder, unspecified type: F90.9

## 2022-03-19 HISTORY — DX: Personal history of urinary calculi: Z87.442

## 2022-03-19 HISTORY — DX: COVID-19: U07.1

## 2022-03-19 LAB — BASIC METABOLIC PANEL
Anion gap: 7 (ref 5–15)
BUN: 15 mg/dL (ref 6–20)
CO2: 26 mmol/L (ref 22–32)
Calcium: 8.6 mg/dL — ABNORMAL LOW (ref 8.9–10.3)
Chloride: 105 mmol/L (ref 98–111)
Creatinine, Ser: 0.82 mg/dL (ref 0.61–1.24)
GFR, Estimated: >60 mL/min (ref >60)
Glucose, Bld: 86 mg/dL (ref 70–99)
Potassium: 4.3 mmol/L (ref 3.5–5.1)
Sodium: 138 mmol/L (ref 135–145)

## 2022-03-19 LAB — CBC
HCT: 42.8 % (ref 39.0–52.0)
Hemoglobin: 14.5 g/dL (ref 13.0–17.0)
MCH: 29.5 pg (ref 26.0–34.0)
MCHC: 33.9 g/dL (ref 30.0–36.0)
MCV: 87 fL (ref 80.0–100.0)
Platelets: 172 10*3/uL (ref 150–400)
RBC: 4.92 MIL/uL (ref 4.22–5.81)
RDW: 12.4 % (ref 11.5–15.5)
WBC: 6.3 10*3/uL (ref 4.0–10.5)
nRBC: 0 % (ref 0.0–0.2)

## 2022-03-19 SURGERY — VAGAL NERVE STIMULATOR IMPLANT
Anesthesia: General | Site: Chest | Laterality: Left

## 2022-03-19 MED ORDER — 0.9 % SODIUM CHLORIDE (POUR BTL) OPTIME
TOPICAL | Status: DC | PRN
  Administered 2022-03-19: 1000 mL

## 2022-03-19 MED ORDER — SUCCINYLCHOLINE CHLORIDE 200 MG/10ML IV SOSY
PREFILLED_SYRINGE | INTRAVENOUS | Status: DC | PRN
Start: 2022-03-19 — End: 2022-03-19
  Administered 2022-03-19: 140 mg via INTRAVENOUS

## 2022-03-19 MED ORDER — DEXAMETHASONE SODIUM PHOSPHATE 10 MG/ML IJ SOLN
INTRAMUSCULAR | Status: AC
  Filled 2022-03-19: qty 1

## 2022-03-19 MED ORDER — CHLORHEXIDINE GLUCONATE CLOTH 2 % EX PADS
6.0000 | MEDICATED_PAD | Freq: Once | CUTANEOUS | Status: DC
Start: 2022-03-19 — End: 2022-03-19

## 2022-03-19 MED ORDER — ORAL CARE MOUTH RINSE: 15.0000 mL | Freq: Once | OROMUCOSAL | Status: AC

## 2022-03-19 MED ORDER — ROCURONIUM BROMIDE 10 MG/ML (PF) SYRINGE
PREFILLED_SYRINGE | INTRAVENOUS | Status: AC
  Filled 2022-03-19: qty 10

## 2022-03-19 MED ORDER — LIDOCAINE-EPINEPHRINE 1 %-1:100000 IJ SOLN
INTRAMUSCULAR | Status: DC | PRN
  Administered 2022-03-19: 5 mL

## 2022-03-19 MED ORDER — FENTANYL CITRATE (PF) 100 MCG/2ML IJ SOLN: 25.0000 ug | INTRAMUSCULAR | Status: DC | PRN

## 2022-03-19 MED ORDER — MIDAZOLAM HCL 2 MG/2ML IJ SOLN
INTRAMUSCULAR | Status: AC
  Filled 2022-03-19: qty 2

## 2022-03-19 MED ORDER — PROPOFOL 10 MG/ML IV BOLUS
INTRAVENOUS | Status: AC
  Filled 2022-03-19: qty 20

## 2022-03-19 MED ORDER — CHLORHEXIDINE GLUCONATE 0.12 % MT SOLN
15.0000 mL | Freq: Once | OROMUCOSAL | Status: AC
  Administered 2022-03-19: 15 mL via OROMUCOSAL
  Filled 2022-03-19: qty 15

## 2022-03-19 MED ORDER — ONDANSETRON HCL 4 MG/2ML IJ SOLN
INTRAMUSCULAR | Status: DC | PRN
Start: 2022-03-19 — End: 2022-03-19
  Administered 2022-03-19: 4 mg via INTRAVENOUS

## 2022-03-19 MED ORDER — CELECOXIB 200 MG PO CAPS
200.0000 mg | ORAL_CAPSULE | Freq: Once | ORAL | Status: AC
  Administered 2022-03-19: 200 mg via ORAL
  Filled 2022-03-19: qty 1

## 2022-03-19 MED ORDER — ONDANSETRON HCL 4 MG/2ML IJ SOLN
INTRAMUSCULAR | Status: AC
  Filled 2022-03-19: qty 2

## 2022-03-19 MED ORDER — THROMBIN 5000 UNITS EX SOLR: OROMUCOSAL | Status: DC | PRN

## 2022-03-19 MED ORDER — CEFAZOLIN SODIUM-DEXTROSE 2-4 GM/100ML-% IV SOLN
2.0000 g | INTRAVENOUS | Status: AC
  Administered 2022-03-19: 2 g via INTRAVENOUS
  Filled 2022-03-19: qty 100

## 2022-03-19 MED ORDER — LIDOCAINE 2% (20 MG/ML) 5 ML SYRINGE
INTRAMUSCULAR | Status: DC | PRN
  Administered 2022-03-19: 100 mg via INTRAVENOUS

## 2022-03-19 MED ORDER — BUPIVACAINE HCL 0.5 % IJ SOLN
INTRAMUSCULAR | Status: DC | PRN
  Administered 2022-03-19: 5 mL

## 2022-03-19 MED ORDER — LACTATED RINGERS IV SOLN: INTRAVENOUS | Status: DC

## 2022-03-19 MED ORDER — THROMBIN 5000 UNITS EX SOLR
CUTANEOUS | Status: AC
  Filled 2022-03-19: qty 5000

## 2022-03-19 MED ORDER — FENTANYL CITRATE (PF) 250 MCG/5ML IJ SOLN
INTRAMUSCULAR | Status: DC | PRN
  Administered 2022-03-19 (×2): 75 ug via INTRAVENOUS

## 2022-03-19 MED ORDER — PHENYLEPHRINE 40 MCG/ML (10ML) SYRINGE FOR IV PUSH (FOR BLOOD PRESSURE SUPPORT)
PREFILLED_SYRINGE | INTRAVENOUS | Status: AC
  Filled 2022-03-19: qty 10

## 2022-03-19 MED ORDER — OXYCODONE-ACETAMINOPHEN 5-325 MG PO TABS: 1.0000 | ORAL_TABLET | Freq: Three times a day (TID) | ORAL | 0 refills | Status: AC | PRN

## 2022-03-19 MED ORDER — ACETAMINOPHEN 500 MG PO TABS
1000.0000 mg | ORAL_TABLET | Freq: Once | ORAL | Status: AC
  Administered 2022-03-19: 1000 mg via ORAL
  Filled 2022-03-19: qty 2

## 2022-03-19 MED ORDER — AMISULPRIDE (ANTIEMETIC) 5 MG/2ML IV SOLN: 10.0000 mg | Freq: Once | INTRAVENOUS | Status: DC | PRN

## 2022-03-19 MED ORDER — FENTANYL CITRATE (PF) 250 MCG/5ML IJ SOLN
INTRAMUSCULAR | Status: AC
  Filled 2022-03-19: qty 5

## 2022-03-19 MED ORDER — ARTIFICIAL TEARS OPHTHALMIC OINT
TOPICAL_OINTMENT | OPHTHALMIC | Status: AC
  Filled 2022-03-19: qty 3.5

## 2022-03-19 MED ORDER — LIDOCAINE 2% (20 MG/ML) 5 ML SYRINGE
INTRAMUSCULAR | Status: AC
  Filled 2022-03-19: qty 5

## 2022-03-19 MED ORDER — SUCCINYLCHOLINE CHLORIDE 200 MG/10ML IV SOSY
PREFILLED_SYRINGE | INTRAVENOUS | Status: AC
  Filled 2022-03-19: qty 10

## 2022-03-19 MED ORDER — PROPOFOL 10 MG/ML IV BOLUS
INTRAVENOUS | Status: DC | PRN
Start: 2022-03-19 — End: 2022-03-19
  Administered 2022-03-19: 200 mg via INTRAVENOUS

## 2022-03-19 MED ORDER — CHLORHEXIDINE GLUCONATE CLOTH 2 % EX PADS: 6.0000 | MEDICATED_PAD | Freq: Once | CUTANEOUS | Status: DC

## 2022-03-19 MED ORDER — LIDOCAINE-EPINEPHRINE 1 %-1:100000 IJ SOLN
INTRAMUSCULAR | Status: AC
  Filled 2022-03-19: qty 1

## 2022-03-19 MED ORDER — MIDAZOLAM HCL 5 MG/5ML IJ SOLN
INTRAMUSCULAR | Status: DC | PRN
  Administered 2022-03-19: 2 mg via INTRAVENOUS

## 2022-03-19 MED ORDER — BUPIVACAINE HCL (PF) 0.5 % IJ SOLN
INTRAMUSCULAR | Status: AC
  Filled 2022-03-19: qty 30

## 2022-03-19 SURGICAL SUPPLY — 47 items
BAG COUNTER SPONGE SURGICOUNT (BAG) ×2 IMPLANT
BLADE CLIPPER SURG (BLADE) ×1 IMPLANT
BLADE SURG 11 STRL SS (BLADE) ×2 IMPLANT
CANISTER SUCT 3000ML PPV (MISCELLANEOUS) ×2 IMPLANT
CARTRIDGE OIL MAESTRO DRILL (MISCELLANEOUS) IMPLANT
DERMABOND ADVANCED (GAUZE/BANDAGES/DRESSINGS) ×1
DERMABOND ADVANCED .7 DNX12 (GAUZE/BANDAGES/DRESSINGS) ×1 IMPLANT
DRAPE CAMERA VIDEO/LASER (DRAPES) ×2 IMPLANT
DRAPE HALF SHEET 40X57 (DRAPES) ×1 IMPLANT
DRAPE LAPAROTOMY 100X72 PEDS (DRAPES) ×2 IMPLANT
DRSG OPSITE POSTOP 3X4 (GAUZE/BANDAGES/DRESSINGS) IMPLANT
DURAPREP 6ML APPLICATOR 50/CS (WOUND CARE) ×2 IMPLANT
ELECT COATED BLADE 2.86 ST (ELECTRODE) ×2 IMPLANT
ELECT REM PT RETURN 9FT ADLT (ELECTROSURGICAL) ×2
ELECTRODE REM PT RTRN 9FT ADLT (ELECTROSURGICAL) ×1 IMPLANT
GAUZE 4X4 16PLY ~~LOC~~+RFID DBL (SPONGE) IMPLANT
GENERATOR MODEL 106 ASPIRE (Neuro Prosthesis/Implant) ×1 IMPLANT
GLOVE SURG ENC MOIS LTX SZ7.5 (GLOVE) ×2 IMPLANT
GLOVE SURG LTX SZ7 (GLOVE) ×2 IMPLANT
GLOVE SURG UNDER POLY LF SZ7.5 (GLOVE) ×4 IMPLANT
GOWN STRL REUS W/ TWL LRG LVL3 (GOWN DISPOSABLE) ×2 IMPLANT
GOWN STRL REUS W/ TWL XL LVL3 (GOWN DISPOSABLE) IMPLANT
GOWN STRL REUS W/TWL 2XL LVL3 (GOWN DISPOSABLE) IMPLANT
GOWN STRL REUS W/TWL LRG LVL3 (GOWN DISPOSABLE) ×2
GOWN STRL REUS W/TWL XL LVL3 (GOWN DISPOSABLE)
HEMOSTAT POWDER KIT SURGIFOAM (HEMOSTASIS) ×2 IMPLANT
KIT BASIN OR (CUSTOM PROCEDURE TRAY) ×2 IMPLANT
KIT TURNOVER KIT B (KITS) ×2 IMPLANT
LOOP VESSEL MAXI BLUE (MISCELLANEOUS) IMPLANT
LOOP VESSEL MINI RED (MISCELLANEOUS) IMPLANT
NEEDLE HYPO 22GX1.5 SAFETY (NEEDLE) ×2 IMPLANT
NS IRRIG 1000ML POUR BTL (IV SOLUTION) ×2 IMPLANT
OIL CARTRIDGE MAESTRO DRILL (MISCELLANEOUS)
PACK LAMINECTOMY NEURO (CUSTOM PROCEDURE TRAY) ×2 IMPLANT
PAD ARMBOARD 7.5X6 YLW CONV (MISCELLANEOUS) ×6 IMPLANT
SPONGE INTESTINAL PEANUT (DISPOSABLE) IMPLANT
SPONGE SURGIFOAM ABS GEL SZ50 (HEMOSTASIS) IMPLANT
SUT ETHILON 3 0 FSL (SUTURE) IMPLANT
SUT NURALON 4 0 TR CR/8 (SUTURE) ×2 IMPLANT
SUT VIC AB 0 CT1 27 (SUTURE) ×1
SUT VIC AB 0 CT1 27XBRD ANBCTR (SUTURE) ×1 IMPLANT
SUT VIC AB 3-0 SH 8-18 (SUTURE) ×2 IMPLANT
SUT VICRYL 3-0 RB1 18 ABS (SUTURE) ×4 IMPLANT
TOWEL GREEN STERILE (TOWEL DISPOSABLE) ×2 IMPLANT
TOWEL GREEN STERILE FF (TOWEL DISPOSABLE) ×2 IMPLANT
VNS therapy patient essentials manual ×1 IMPLANT
WATER STERILE IRR 1000ML POUR (IV SOLUTION) ×2 IMPLANT

## 2022-03-19 NOTE — Anesthesia Procedure Notes (Signed)
Procedure Name: LMA Insertion ?Date/Time: 03/19/2022 8:40 AM ?Performed by: Demetrio Lapping, CRNA ?Pre-anesthesia Checklist: Patient identified, Emergency Drugs available, Suction available and Patient being monitored ?Patient Re-evaluated:Patient Re-evaluated prior to induction ?Oxygen Delivery Method: Circle System Utilized ?Preoxygenation: Pre-oxygenation with 100% oxygen ?Induction Type: IV induction ?LMA: LMA inserted ?LMA Size: 4.0 and 5.0 ?Number of attempts: 1 ?Airway Equipment and Method: Bite block ?Placement Confirmation: positive ETCO2 ?Tube secured with: Tape ?Dental Injury: Teeth and Oropharynx as per pre-operative assessment  ? ? ? ? ?

## 2022-03-19 NOTE — Transfer of Care (Signed)
Immediate Anesthesia Transfer of Care Note ? ?Patient: Scott Macdonald ? ?Procedure(s) Performed: VNS BATTERY REPLACEMENT (Left: Chest) ? ?Patient Location: PACU ? ?Anesthesia Type:General ? ?Level of Consciousness: awake, alert , oriented and patient cooperative ? ?Airway & Oxygen Therapy: Patient Spontanous Breathing ? ?Post-op Assessment: Report given to RN and Post -op Vital signs reviewed and stable ? ?Post vital signs: Reviewed and stable ? ?Last Vitals:  ?Vitals Value Taken Time  ?BP 166/109 03/19/22 0942  ?Temp    ?Pulse 88 03/19/22 0943  ?Resp 20 03/19/22 0943  ?SpO2 100 % 03/19/22 0943  ?Vitals shown include unvalidated device data. ? ?Last Pain:  ?Vitals:  ? 03/19/22 0638  ?TempSrc:   ?PainSc: 1   ?   ? ?  ? ?Complications: No notable events documented. ?

## 2022-03-19 NOTE — Discharge Summary (Signed)
?Physician Discharge Summary  ?Patient ID: ?Scott Macdonald ?MRN: [id number] ?DOB/AGE: [birth year] 37 y.o. ? ?Admit date: 03/19/2022 ?Discharge date: 03/19/2022 ? ?Admission Diagnoses:  ?VNS end-of-life ? ?Discharge Diagnoses:  ?Same ?Active Problems: ?  * No active hospital problems. * ? ? ?Discharged Condition: Stable ? ?Hospital Course:  ?Scott Macdonald is a 36 y.o. male who underwent uncomplicated VNS battery change. He was at baseline postop and discharged in stable condition. ? ?Treatments: Surgery - VNS  ? ?Discharge Exam: ?Blood pressure (!) 166/109, pulse 84, temperature 97.7 ?F (36.5 ?C), resp. rate 17, height 6\' 1"  (1.854 m), weight 132.5 kg, SpO2 100 %. ?Awake, alert, oriented ?Speech fluent, appropriate ?CN grossly intact ?5/5 BUE/BLE ?Wound c/d/i ? ?Disposition: Discharge disposition: 01-Home or Self Care ? ? ? ? ? ? ?Discharge Instructions   ? ? Call MD for:  redness, tenderness, or signs of infection (pain, swelling, redness, odor or green/yellow discharge around incision site)   Complete by: As directed ?  ? Call MD for:  temperature >100.4   Complete by: As directed ?  ? Diet - low sodium heart healthy   Complete by: As directed ?  ? Discharge instructions   Complete by: As directed ?  ? Walk at home as much as possible, at least 4 times / day  ? Increase activity slowly   Complete by: As directed ?  ? Lifting restrictions   Complete by: As directed ?  ? No lifting > 10 lbs  ? May shower / Bathe   Complete by: As directed ?  ? 48 hours after surgery  ? May walk up steps   Complete by: As directed ?  ? Other Restrictions   Complete by: As directed ?  ? No bending/twisting at waist  ? Remove dressing in 48 hours   Complete by: As directed ?  ? ?  ? ?Allergies as of 03/19/2022   ? ?   Reactions  ? Lorazepam Itching  ? Prednisone Other (See Comments)  ? Pt states it makes his seizures worse.  ? ?  ? ?  ?Medication List  ?  ? ?STOP taking these medications   ? ?flecainide 100 MG tablet ?Commonly known as: TAMBOCOR ?   ?lisinopril 10 MG tablet ?Commonly known as: ZESTRIL ?  ?loratadine 10 MG tablet ?Commonly known as: CLARITIN ?  ?metoprolol succinate 100 MG 24 hr tablet ?Commonly known as: TOPROL-XL ?  ?ondansetron 4 MG disintegrating tablet ?Commonly known as: ZOFRAN-ODT ?  ?oxyCODONE 5 MG/5ML solution ?Commonly known as: ROXICODONE ?  ?pantoprazole 40 MG tablet ?Commonly known as: Protonix ?  ?PARoxetine 10 MG tablet ?Commonly known as: PAXIL ?  ?Xarelto 20 MG Tabs tablet ?Generic drug: rivaroxaban ?  ? ?  ? ?TAKE these medications   ? ?cetirizine 5 MG tablet ?Commonly known as: ZYRTEC ?Take 5 mg by mouth daily. ?  ?divalproex 500 MG DR tablet ?Commonly known as: DEPAKOTE ?Take 500 mg by mouth 2 (two) times daily. Takes 2 500 mg tabs in the AM and takes 2 500 mg tabs in the PM ?What changed: Another medication with the same name was removed. Continue taking this medication, and follow the directions you see here. ?  ?fluticasone 50 MCG/ACT nasal spray ?Commonly known as: FLONASE ?Place 1 spray into both nostrils daily. ?  ?oxyCODONE-acetaminophen 5-325 MG tablet ?Commonly known as: Percocet ?Take 1 tablet by mouth every 8 (eight) hours as needed for severe pain. ?  ? ?  ? ? ? ?Signed: ?  Jackelyn Hoehn ?03/19/2022, 9:48 AM ? ? ?

## 2022-03-19 NOTE — Op Note (Signed)
?  NEUROSURGERY OPERATIVE NOTE  ? ?PREOP DIAGNOSIS:  ?VNS battery end-of-life ?Medically intractable epilepsy  ? ?POSTOP DIAGNOSIS: Same ? ?PROCEDURE: ?1. Replacement of left Vagal nerve stimulator battery ? ?SURGEON: Dr. Lisbeth Renshaw, MD ? ?ASSISTANT: None ? ?ANESTHESIA: General Endotracheal ? ?EBL: Minimal ? ?SPECIMENS: None ? ?DRAINS: None ? ?COMPLICATIONS: None immediate ? ?CONDITION: Hemodynamically stable to PACU ? ?HISTORY: ?Scott Macdonald is a 36 y.o. who was initially seen in the outpatient clinic who previously underwent placement of a VNS several years ago. Recent interrogation revealed near end-of-life of the battery. The risks and benefits of the surgery were reviewed in detail. After all questions were answered, informed consent was obtained. ? ?PROCEDURE IN DETAIL: ?After informed consent was obtained and witnessed, the patient was brought to the operating room. After induction of general anesthesia, the patient was positioned on the operative table in the supine position. All pressure points were meticulously padded. Left infraclavicular skin incision was then marked out and prepped and draped in the usual sterile fashion. ? ?After timeout was conducted, skin incision was infiltrated with local anesthetic with epinephrine. Skin incision was then made sharply, the subcutaneous pocket was opened and the generator identified. This was explanted. The lead was removed and the new generator connected and replaced in the subcutaneous pocket. The new generator was interrogated and noted to have normal lead impedence, accurate hear rate detection and was reprogrammed with the previous settings. Wound was then irrigated with saline and closed with interrupted 3-0 Vicryl stitches. Skin was closed with dermabond. ? ?At the end of the case all sponge, needle, cottonoid, and instrument counts were correct. The patient was then extubated, transferred to the stretcher, and taken to the postanesthesia care unit in  stable hemodynamic condition. ? ? ?Lisbeth Renshaw, MD ?[facility]  ? ?

## 2022-03-19 NOTE — Anesthesia Postprocedure Evaluation (Signed)
Anesthesia Post Note ? ?Patient: Scott Macdonald ? ?Procedure(s) Performed: VNS BATTERY REPLACEMENT (Left: Chest) ? ?  ? ?Patient location during evaluation: PACU ?Anesthesia Type: General ?Level of consciousness: sedated ?Pain management: pain level controlled ?Vital Signs Assessment: post-procedure vital signs reviewed and stable ?Respiratory status: spontaneous breathing and respiratory function stable ?Cardiovascular status: stable ?Postop Assessment: no apparent nausea or vomiting ?Anesthetic complications: no ? ? ?No notable events documented. ? ?Last Vitals:  ?Vitals:  ? 03/19/22 0957 03/19/22 1000  ?BP: (!) 140/101 113/79  ?Pulse: 87 75  ?Resp: 17 13  ?Temp:  36.5 ?C  ?SpO2: 98% 99%  ?  ?Last Pain:  ?Vitals:  ? 03/19/22 0957  ?TempSrc:   ?PainSc: Asleep  ? ? ?  ?  ?  ?  ?  ?  ? ?Shaneice Barsanti DANIEL ? ? ? ? ?

## 2022-03-19 NOTE — Anesthesia Procedure Notes (Signed)
Procedure Name: Intubation ?Date/Time: 03/19/2022 8:52 AM ?Performed by: Lowella Dell, CRNA ?Pre-anesthesia Checklist: Patient identified, Emergency Drugs available, Suction available and Patient being monitored ?Patient Re-evaluated:Patient Re-evaluated prior to induction ?Oxygen Delivery Method: Circle System Utilized ?Preoxygenation: Pre-oxygenation with 100% oxygen ?Induction Type: Combination inhalational/ intravenous induction and Rapid sequence ?Laryngoscope Size: Mac and 4 ?Grade View: Grade II ?Tube type: Oral ?Number of attempts: 1 ?Airway Equipment and Method: Stylet ?Placement Confirmation: ETT inserted through vocal cords under direct vision, positive ETCO2 and breath sounds checked- equal and bilateral ?Secured at: 23 cm ?Tube secured with: Tape ?Dental Injury: Teeth and Oropharynx as per pre-operative assessment  ? ? ? ? ?

## 2022-03-19 NOTE — H&P (Signed)
?Chief Complaint  ?Intractable epilepsy ? ?History of Present Illness  ?Scott Macdonald is a 36 y.o. male with a history of medically intractable epilepsy who previously underwent placement of a left-sided vagal nerve stimulator.  While he does report improvement in his seizures with vagal nerve stimulator therapy, his most recent battery change has only provided a few years of therapy.  Recent interrogation has revealed near end-of-life between 11 and 25% remaining.  He therefore presents for battery change. ? ?Past Medical History  ? ?Past Medical History:  ?Diagnosis Date  ? A-fib (HCC)   ? states one time occurrence and it was a seizure  ? ADHD (attention deficit hyperactivity disorder)   ? was on ritalin in middle school  ? Anemia   ? as a child  ? Arrhythmia   ? Complication of anesthesia   ? narrow passageway  ? COVID   ? mild case  ? Herniated lumbar intervertebral disc   ? patient reported   ? History of kidney stones   ? Seizures (HCC)   ? had 4 seizures on December 24, 2021  ? Sleep apnea   ? no longer uses a cpap  ? Status post VNS (vagus nerve stimulator) placement 2017  ? implanted for treatment of seizures   ? ? ?Past Surgical History  ? ?Past Surgical History:  ?Procedure Laterality Date  ? FINGER FRACTURE SURGERY    ? middle finger - caught in machinery  ? IMPLANTATION VAGAL NERVE STIMULATOR  09/2016  ? per patient Dr Loleta Chance [facility] ; patient reports he was told to inform anesthesia that they must be conscious of degree of neck extension and manipulaton during intubation to avoid dislodgment of wiring for stimulator   ? LAPAROSCOPIC GASTRIC SLEEVE RESECTION N/A 11/09/2017  ? Procedure: LAPAROSCOPIC GASTRIC SLEEVE RESECTION WITH UPPER ENDO;  Surgeon: Berna Bue, MD;  Location: [facility];  Service: General;  Laterality: N/A;  ? ? ?Social History  ? ?Social History  ? ?Tobacco Use  ? Smoking status: Former  ?  Types: Cigarettes  ?  Quit date: 09/01/2007  ?  Years since quitting: 14.5  ?  Smokeless tobacco: Never  ?Vaping Use  ? Vaping Use: Never used  ?Substance Use Topics  ? Alcohol use: Yes  ?  Comment: seldom   ? Drug use: No  ? ? ?Medications  ? ?Prior to Admission medications   ?Medication Sig Start Date End Date Taking? Authorizing Provider  ?cetirizine (ZYRTEC) 5 MG tablet Take 5 mg by mouth daily.   Yes [provider]  ?divalproex (DEPAKOTE) 500 MG DR tablet Take 500 mg by mouth 2 (two) times daily. Takes 2 500 mg tabs in the AM and takes 2 500 mg tabs in the PM   Yes [provider]  ?fluticasone (FLONASE) 50 MCG/ACT nasal spray Place 1 spray into both nostrils daily.   Yes [provider]  ?divalproex (DEPAKOTE SPRINKLE) 125 MG capsule Take 4 capsules (500 mg total) by mouth every 6 (six) hours. 11/10/17   Berna Bue, MD  ?flecainide (TAMBOCOR) 100 MG tablet Take 100 mg 2 (two) times daily by mouth.    [provider]  ?lisinopril (PRINIVIL,ZESTRIL) 10 MG tablet Take 25 mg by mouth daily.    [provider]  ?loratadine (CLARITIN) 10 MG tablet Take 10 mg by mouth daily.    [provider]  ?metoprolol succinate (TOPROL-XL) 100 MG 24 hr tablet Take 50 mg by mouth daily. Take with  or immediately following a meal.    [provider]  ?ondansetron (ZOFRAN-ODT) 4 MG disintegrating tablet Take 1 tablet (4 mg total) by mouth every 8 (eight) hours as needed for nausea or vomiting. 11/10/17   Berna Bue, MD  ?oxyCODONE (ROXICODONE) 5 MG/5ML solution Take 5-10 mLs (5-10 mg total) by mouth every 4 (four) hours as needed for moderate pain or severe pain (please offer tylenol first). 11/10/17   Berna Bue, MD  ?pantoprazole (PROTONIX) 40 MG tablet Take 1 tablet (40 mg total) by mouth daily. 11/10/17   Berna Bue, MD  ?PARoxetine (PAXIL) 10 MG tablet Take 10 mg by mouth daily.    [provider]  ?XARELTO 20 MG TABS tablet TAKE 1 TABLET (20 MG TOTAL) BY MOUTH DAILY WITH SUPPER. 12/01/17   Revankar, Aundra Dubin, MD  ? ? ?Allergies  ? ?Allergies  ?Allergen Reactions  ? Lorazepam Itching  ? Prednisone Other (See Comments)  ?  Pt states it makes his seizures worse.  ? ? ?Review of Systems  ?ROS ? ?Neurologic Exam  ?Awake, alert, oriented ?Memory and concentration grossly intact ?Speech fluent, appropriate ?CN grossly intact ?Motor exam: ?Upper Extremities Deltoid Bicep Tricep Grip  ?Right 5/5 5/5 5/5 5/5  ?Left 5/5 5/5 5/5 5/5  ? ?Lower Extremities IP Quad PF DF EHL  ?Right 5/5 5/5 5/5 5/5 5/5  ?Left 5/5 5/5 5/5 5/5 5/5  ? ?Sensation grossly intact to LT ? ?Impression  ?- 36 y.o. male with previous implantation of a left-sided vagal nerve stimulator with recent interrogation revealing near end-of-life. ? ?Plan  ?-We will plan on proceeding with left-sided vagal nerve stimulator battery change ? ?I have reviewed the details of the procedure as well as the expected postoperative course and recovery with the patient.  We have also discussed the associated risks, benefits and alternatives to the surgery.  In addition, I did tell him that should interrogation of the device reveal poor lead impedance, he may require exploration of the neck leads which would be done at a separate time, possibly at a referral to a tertiary center.  All his questions today were answered.  He provided informed consent to proceed. ? ? ?Lisbeth Renshaw, MD ?[facility]  ? ?

## 2022-03-21 ENCOUNTER — Encounter (HOSPITAL_COMMUNITY): Payer: Self-pay | Admitting: Neurosurgery

## 2024-10-21 ENCOUNTER — Encounter: Payer: Self-pay | Admitting: Internal Medicine

## 2024-10-21 ENCOUNTER — Ambulatory Visit: Admitting: Internal Medicine

## 2024-10-21 ENCOUNTER — Other Ambulatory Visit: Payer: Self-pay

## 2024-10-21 VITALS — BP 120/78 | HR 73 | Temp 98.1°F | Resp 18 | Ht 73.0 in | Wt 298.6 lb

## 2024-10-21 DIAGNOSIS — J3089 Other allergic rhinitis: Secondary | ICD-10-CM | POA: Diagnosis not present

## 2024-10-21 DIAGNOSIS — J342 Deviated nasal septum: Secondary | ICD-10-CM | POA: Diagnosis not present

## 2024-10-21 DIAGNOSIS — J34829 Nasal valve collapse, unspecified: Secondary | ICD-10-CM | POA: Diagnosis not present

## 2024-10-21 DIAGNOSIS — R1319 Other dysphagia: Secondary | ICD-10-CM | POA: Diagnosis not present

## 2024-10-21 DIAGNOSIS — J302 Other seasonal allergic rhinitis: Secondary | ICD-10-CM

## 2024-10-21 DIAGNOSIS — K219 Gastro-esophageal reflux disease without esophagitis: Secondary | ICD-10-CM | POA: Insufficient documentation

## 2024-10-21 MED ORDER — IPRATROPIUM BROMIDE 0.03 % NA SOLN: 2.0000 | Freq: Three times a day (TID) | NASAL | 5 refills | Status: AC | PRN

## 2024-10-21 MED ORDER — AZELASTINE HCL 0.1 % NA SOLN: 2.0000 | Freq: Two times a day (BID) | NASAL | 5 refills | Status: AC | PRN

## 2024-10-21 NOTE — Progress Notes (Signed)
 NEW PATIENT Date of Service/Encounter:   10/21/2024 Referring provider: Azalee Powell Macdonald Macdonald Primary care provider: Azalee Powell JONETTA, PA-C  Subjective:  Scott Macdonald is a 38 y.o. male with a PMHx of seizures, paroxysmal atrial fibrillation, central hypertension, morbid obesity, OSA presenting today for evaluation of chronic rhinitis. History obtained from: chart review and patient.   Discussed the use of AI scribe software for clinical note transcription with the patient, who gave verbal consent to proceed.  History of Present Illness Scott Macdonald is a 38 year old male with allergies and sleep apnea who presents with worsening allergy symptoms and breathing difficulties.  Allergic rhinitis and seasonal allergies - Severe allergy symptoms exacerbated by exposure to dogs, cats, and seasonal changes, especially in the spring - Symptoms include severe headaches, nasal congestion, chest congestion, drainage, sore throat, body aches, and chills - Symptoms are described as debilitating and similar to influenza - Allergy management includes Allegra (fexofenadine) 180 mg once daily and generic Claritin  (loratadine ) 10 mg daily, which provide partial relief - Exposure to a hypoallergenic dog triggers severe reactions including red, itchy, bloodshot eyes, and sneezing - Previously used Flonase but discontinued due to epistaxis; currently using Nasacort as prescribed by ENT - Masks provided for use at work to reduce exposure to dust and irritants, works in holiday representative  Nasal obstruction and structural abnormalities - One side of nose collapses upon deep inhalation - ENT specialist diagnosed a deviated septum - CT scan performed earlier this year to assess nasal anatomy  Sleep-disordered breathing - History of sleep apnea - Uses CPAP machine but perceives minimal improvement in symptoms  Gastroesophageal reflux and airway symptoms - Episodes of choking on saliva resulting in  difficulty inhaling - Diagnosed with acid reflux - Currently taking omeprazole, tapered from 40 mg to 20 mg - Improvement in choking episodes with less frequency and severity, corroborated by spouse since starting PPI - has to eat with a glass of water nearby - sometimes when eating rice, feels it gets stuck in his throat and he has to drink a lot of water to get it down  Seizure disorder and neurological symptoms - Seizure history since age 59 - Significant event in March 2025 with five grand mal seizures and neck fracture - Memory impairment around time of seizures - Swallowing difficulties seemed to have worsened around the time he had his neck injury  Other allergy screening: Asthma: no Food allergy: no Eczema:no  Past Medical History: Past Medical History:  Diagnosis Date   A-fib (HCC)    states one time occurrence and it was a seizure   ADHD (attention deficit hyperactivity disorder)    was on ritalin in middle school   Anemia    as a child   Arrhythmia    Complication of anesthesia    narrow passageway   COVID    mild case   Herniated lumbar intervertebral disc    patient reported    History of kidney stones    Seizures (HCC)    had 4 seizures on December 24, 2021   Sleep apnea    no longer uses a cpap   Status post VNS (vagus nerve stimulator) placement 2017   implanted for treatment of seizures    Medication List:  Current Outpatient Medications  Medication Sig Dispense Refill   azelastine (ASTELIN) 0.1 % nasal spray Place 2 sprays into both nostrils 2 (two) times daily as needed for rhinitis. Use in each nostril as directed 30 mL 5  cetirizine (ZYRTEC) 5 MG tablet Take 5 mg by mouth daily.     divalproex  (DEPAKOTE ) 500 MG DR tablet Take 500 mg by mouth 2 (two) times daily. Takes 2 500 mg tabs in the AM and takes 2 500 mg tabs in the PM     fluticasone (FLONASE) 50 MCG/ACT nasal spray Place 1 spray into both nostrils daily.     No current  facility-administered medications for this visit.   Known Allergies:  Allergies  Allergen Reactions   Lorazepam Itching   Prednisone Other (See Comments)    Pt states it makes his seizures worse.   Past Surgical History: Past Surgical History:  Procedure Laterality Date   FINGER FRACTURE SURGERY     middle finger - caught in machinery   IMPLANTATION VAGAL NERVE STIMULATOR  09/2016   per patient Dr Leigh [facility] ; patient reports he was told to inform anesthesia that they must be conscious of degree of neck extension and manipulaton during intubation to avoid dislodgment of wiring for stimulator    LAPAROSCOPIC GASTRIC SLEEVE RESECTION N/A 11/09/2017   Procedure: LAPAROSCOPIC GASTRIC SLEEVE RESECTION WITH UPPER ENDO;  Surgeon: Signe Mitzie LABOR, MD;  Location: [facility];  Service: General;  Laterality: N/A;   VAGUS NERVE STIMULATOR INSERTION Left 03/19/2022   Procedure: VNS BATTERY REPLACEMENT;  Surgeon: Lanis Pupa, MD;  Location: [facility];  Service: Neurosurgery;  Laterality: Left;   Family History: Family History  Problem Relation Age of Onset   Allergic rhinitis Mother    Hypertension Mother    Hypertension Father    Hypertension Brother    Cancer Other    COPD Other    Hypertension Other    Social History: Scott Macdonald lives in a house built 35 years ago, no water damage, carpet in the bedroom, heat pump eating and gas, no pets, no roaches, not using dust mite covers in the bed of the pillows.  He is a psychologist, sport and exercise doing art therapist.  Work exposes him to fumes chemicals and dust.  No HEPA filter in the home.  [place]   ROS:  All other systems negative except as noted per HPI.  Objective:  Blood pressure 120/78, pulse 73, temperature 98.1 F (36.7 C), temperature source Temporal, resp. rate 18, height 6' 1 (1.854 m), weight 298 lb 9.6 oz (135.4 kg), SpO2 98%. Body mass index is 39.4 kg/m. Physical  Exam:  General Appearance:  Alert, cooperative, no distress, appears stated age  Head:  Normocephalic, without obvious abnormality, atraumatic  Eyes:  Conjunctiva clear, EOM's intact  Ears EACs normal bilaterally and normal TMs bilaterally  Nose: Nares normal with right nasal valve collapse noted on inhalation, hypertrophic turbinates, normal mucosa, and no visible anterior polyps  Throat: Lips, tongue normal; teeth and gums normal, normal posterior oropharynx  Neck: Supple, symmetrical  Lungs:   clear to auscultation bilaterally, Respirations unlabored, no coughing  Heart:  regular rate and rhythm and no murmur, Appears well perfused  Extremities: No edema  Skin: Skin color, texture, turgor normal and no rashes or lesions on visualized portions of skin  Neurologic: No gross deficits   Diagnostics:  Labs:  Lab Orders  No laboratory test(s) ordered today     Assessment and Plan  Assessment and Plan Assessment & Plan Allergic rhinitis with perennial and seasonal triggers Chronic allergic rhinitis exacerbated by exposure to dogs, cats, and seasonal changes, presenting with headaches, nasal congestion, chest congestion, sore throat, and occasionally body aches  and chills. Current management with fexofenadine and loratadine  provides partial relief. Previous use of Flonase resulted in epistaxis, and current use of Nasacort is ongoing. Nasal congestion is further complicated by a deviated septum. - Continue fexofenadine 180 mg daily and loratadine  10 mg daily. - Consider allergy shots for long-term management pending allergy testing. - Continue Nasacort as prescribed by ENT. 2 sprays daily - continue Azelastine 2 sprays twice daily as needed for congestion - prescribe atrovent nasal spray for symptomatic relief of nasal congestion, with instructions to use 1-2 sprays up to three times daily as needed. - Next week for allergy testing  Deviated nasal septum Deviated nasal septum causing  nasal obstruction, particularly on deep inhalation (right nasal valve collapse). ENT has discussed potential surgical intervention but is currently managing with medical therapy to assess response. Surgery may be considered if medical management fails. - Continue ENT follow-up  Dysphagia, possible eosinophilic esophagitis Dysphagia with sensation of food, particularly rice, getting stuck in the esophagus.  Has to have water nearby when eating.  Symptoms suggestive of eosinophilic esophagitis (EoE), potentially exacerbated by allergies. No prior allergy testing. Symptoms may be related to or exacerbated by gastroesophageal reflux disease (GERD) or nerve damage from previous neck injury. Discussion of need for GI evaluation to confirm diagnosis and guide treatment. - Refer to gastroenterology for evaluation and possible biopsy - Monitor for correlation between allergy flares and dysphagia symptoms.  Gastroesophageal reflux disease GERD managed with omeprazole, currently tapering from 40 mg to 20 mg with plans to discontinue over a year. Symptoms have improved with treatment, and choking episodes have decreased in severity. ENT has advised discontinuation of omeprazole due to potential bone density issues. - Continue current tapering regimen of omeprazole as directed by ENT. - Monitor symptoms and adjust treatment as needed.  Follow up : next Thursday November 6th at 9:30 AM (1-68) It was a pleasure meeting you in clinic today! Thank you for allowing me to participate in your care.  Rocky Endow, MD [facility]       This note in its entirety was forwarded to the Provider who requested this consultation.  Other: none  Thank you for your kind referral. I appreciate the opportunity to take part in Scott Macdonald's care. Please do not hesitate to contact me with questions.  Sincerely,  Rocky Endow, MD [facility] 

## 2024-10-21 NOTE — Patient Instructions (Signed)
 Allergic rhinitis with perennial and seasonal triggers Chronic allergic rhinitis exacerbated by exposure to dogs, cats, and seasonal changes, presenting with headaches, nasal congestion, chest congestion, sore throat, and occasionally body aches and chills. Current management with fexofenadine and loratadine  provides partial relief. Previous use of Flonase resulted in epistaxis, and current use of Nasacort is ongoing. Nasal congestion is further complicated by a deviated septum. - Continue fexofenadine 180 mg daily and loratadine  10 mg daily. - Consider allergy shots for long-term management pending allergy testing. - Continue Nasacort as prescribed by ENT. 2 sprays daily - continue Azelastine 2 sprays twice daily as needed for congestion - prescribe atrovent nasal spray for symptomatic relief of nasal congestion, with instructions to use 1-2 sprays up to three times daily as needed. - Next week for allergy testing  Deviated nasal septum Deviated nasal septum causing nasal obstruction, particularly on deep inhalation (right nasal valve collapse). ENT has discussed potential surgical intervention but is currently managing with medical therapy to assess response. Surgery may be considered if medical management fails. - Continue ENT follow-up  Dysphagia, possible eosinophilic esophagitis Dysphagia with sensation of food, particularly rice, getting stuck in the esophagus.  Has to have water nearby when eating.  Symptoms suggestive of eosinophilic esophagitis (EoE), potentially exacerbated by allergies. No prior allergy testing. Symptoms may be related to or exacerbated by gastroesophageal reflux disease (GERD) or nerve damage from previous neck injury. Discussion of need for GI evaluation to confirm diagnosis and guide treatment. - Refer to gastroenterology for evaluation and possible biopsy - Monitor for correlation between allergy flares and dysphagia symptoms.  Gastroesophageal reflux disease GERD  managed with omeprazole, currently tapering from 40 mg to 20 mg with plans to discontinue over a year. Symptoms have improved with treatment, and choking episodes have decreased in severity. ENT has advised discontinuation of omeprazole due to potential bone density issues. - Continue current tapering regimen of omeprazole as directed by ENT. - Monitor symptoms and adjust treatment as needed.  Follow up : next Thursday November 6th at 9:30 AM (1-68) It was a pleasure meeting you in clinic today! Thank you for allowing me to participate in your care.  Rocky Endow, MD [facility]

## 2024-10-27 ENCOUNTER — Encounter: Payer: Self-pay | Admitting: Internal Medicine

## 2024-10-27 ENCOUNTER — Ambulatory Visit (INDEPENDENT_AMBULATORY_CARE_PROVIDER_SITE_OTHER): Admitting: Internal Medicine

## 2024-10-27 DIAGNOSIS — J302 Other seasonal allergic rhinitis: Secondary | ICD-10-CM

## 2024-10-27 DIAGNOSIS — J3089 Other allergic rhinitis: Secondary | ICD-10-CM

## 2024-10-27 NOTE — Progress Notes (Signed)
 Date of Service/Encounter:  10/27/24  Allergy testing appointment   Initial visit on 10/21/24, seen for allergic rhinitis, deviated nasal septum, dysphagia and concern for EoE.  Please see that note for additional details.  Today reports for allergy diagnostic testing:    DIAGNOSTICS:  Skin Testing: Environmental allergy panel and select foods. Adequate positive and negative controls. Results discussed with patient/family.  Airborne Adult Perc - 10/27/24 0926     Time Antigen Placed 9073    Allergen Manufacturer Jestine    Location Back    Number of Test 55    Panel 1 Select    1. Control-Buffer 50% Glycerol Negative    2. Control-Histamine 3+    3. Bahia 3+    4. Bermuda 3+    5. Johnson 3+    6. [place]  Blue 3+    7. Meadow Fescue 3+    8. Perennial Rye 3+    9. Dandra 3+    10. Ragweed Mix 3+    11. Cocklebur Negative    12. Plantain,  English Negative    13. Baccharis Negative    14. Dog Fennel 3+    15. Russian Thistle Negative    16. Lamb's Quarters Negative    17. Sheep Sorrell Negative    18. Rough Pigweed 3+    19. Marsh Elder, Rough Negative    20. Mugwort, Common Negative    21. Box, Elder Negative    22. Cedar, red 3+    23. Sweet Gum Negative    24. Pecan Pollen 3+    25. Pine Mix Negative    26. Walnut, Black Pollen 3+    27. Red Mulberry Negative    28. Ash Mix Negative    29. Birch Mix 3+    30. Beech American Negative    31. Cottonwood, Eastern Negative    32. Hickory, White 3+    33. Maple Mix 2+    34. Oak, Eastern Mix 3+    35. Sycamore Eastern Negative    36. Alternaria Alternata Negative    37. Cladosporium Herbarum Negative    38. Aspergillus Mix Negative    39. Penicillium Mix Negative    40. Bipolaris Sorokiniana (Helminthosporium) Negative    41. Drechslera Spicifera (Curvularia) Negative    42. Mucor Plumbeus Negative    43. Fusarium Moniliforme Negative    44. Aureobasidium Pullulans (pullulara) Negative    45. Rhizopus  Oryzae Negative    46. Botrytis Cinera Negative    47. Epicoccum Nigrum Negative    48. Phoma Betae Negative    49. Dust Mite Mix 3+    50. Cat Hair 10,000 BAU/ml Negative    51.  Dog Epithelia Negative    52. Mixed Feathers Negative    53. Horse Epithelia Negative    54. Cockroach, German Negative    55. Tobacco Leaf Negative          13 Food Perc - 10/27/24 0926       Test Information   Time Antigen Placed 9073    Allergen Manufacturer Jestine    Location Back    Number of allergen test 13    Food Select      Food   1. Peanut Negative    2. Soybean Negative    3. Wheat Negative    4. Sesame Negative    5. Milk, Cow Negative    6. Casein Negative    7. Egg White, Chicken Negative  8. Shellfish Mix Negative    9. Fish Mix Negative    10. Cashew Negative    11. Walnut Food Negative    12. Almond Negative    13. Hazelnut Negative      Comments   Comments negative   rice         Intradermal - 10/27/24 1000     Time Antigen Placed 1010    Location Arm    Number of Test 8    Control Negative    Mold 1 Negative    Mold 2 Negative    Mold 3 Negative    Mold 4 Negative    Cat 3+    Dog 3+    Cockroach 3+          Allergy testing results were read and interpreted by myself, documented by clinical staff.  Patient provided with copy of allergy testing along with avoidance measures when indicated.   Rocky Endow, MD  [facility]     ------------------------------ Allergic rhinitis with perennial and seasonal triggers Chronic allergic rhinitis exacerbated by exposure to dogs, cats, and seasonal changes, presenting with headaches, nasal congestion, chest congestion, sore throat, and occasionally body aches and chills. Current management with fexofenadine and loratadine  provides partial relief. Previous use of Flonase resulted in epistaxis, and current use of Nasacort is ongoing. Nasal congestion is further complicated by a  deviated septum. - Continue fexofenadine 180 mg daily and loratadine  10 mg daily. - Consider allergy shots for long-term management pending allergy testing. - Continue Nasacort as prescribed by ENT. 2 sprays daily - continue Azelastine 2 sprays twice daily as needed for congestion - prescribe atrovent nasal spray for symptomatic relief of nasal congestion, with instructions to use 1-2 sprays up to three times daily as needed. - environmental skin allergy test 10/27/24: positive to grass, weed, trees, dust mite; intradermals positive to cat dog and cockroach; allergen avoidance. 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 ENT has discussed potential surgical intervention but is currently managing with medical therapy to assess response. Surgery may be considered if medical management fails. - Continue ENT follow-up  Dysphagia, possible eosinophilic esophagitis Dysphagia with sensation of food, particularly rice, getting stuck in the esophagus.  Has to have water nearby when eating.  Symptoms suggestive of eosinophilic esophagitis (EoE), potentially exacerbated by allergies. No prior allergy testing. Symptoms may be related to or exacerbated by gastroesophageal reflux disease (GERD) or nerve damage from previous neck injury. Discussion of need for GI evaluation to confirm diagnosis and guide treatment. - Referred to gastroenterology for evaluation and possible biopsy - Monitor for correlation between allergy flares and dysphagia symptoms. - most common food allergens and rice skin testing 10/27/24: negative  Gastroesophageal reflux disease GERD managed with omeprazole, currently tapering from 40 mg to 20 mg with plans to  discontinue over a year. Symptoms have improved with treatment, and choking episodes have decreased in severity. ENT has advised discontinuation of omeprazole due to potential bone density issues. - Continue current tapering regimen of omeprazole as directed by ENT. - Monitor symptoms and adjust treatment as needed.  Follow up : 3 months, sooner if needed-sooner if starting RUSH allergy injections. It was a pleasure seeing you again in clinic today! Thank you  for allowing me to participate in your care.  Rocky Endow, MD [facility]

## 2024-10-27 NOTE — Patient Instructions (Addendum)
 Allergic rhinitis with perennial and seasonal triggers Chronic allergic rhinitis exacerbated by exposure to dogs, cats, and seasonal changes, presenting with headaches, nasal congestion, chest congestion, sore throat, and occasionally body aches and chills. Current management with fexofenadine and loratadine  provides partial relief. Previous use of Flonase resulted in epistaxis, and current use of Nasacort is ongoing. Nasal congestion is further complicated by a deviated septum. - Continue fexofenadine 180 mg daily and loratadine  10 mg daily. - Consider allergy shots for long-term management pending allergy testing. - Continue Nasacort as prescribed by ENT. 2 sprays daily - continue Azelastine 2 sprays twice daily as needed for congestion - prescribe atrovent nasal spray for symptomatic relief of nasal congestion, with instructions to use 1-2 sprays up to three times daily as needed. - environmental skin allergy test 10/27/24: positive to grass, weed, trees, dust mite; intradermals positive to cat dog and cockroach; allergen avoidance. 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 ENT has discussed potential surgical intervention but is currently managing with medical therapy to assess response. Surgery may be considered if medical management fails. - Continue ENT follow-up  Dysphagia, possible eosinophilic esophagitis Dysphagia with sensation of food, particularly rice, getting stuck in the esophagus.  Has to have water nearby when eating.  Symptoms suggestive of eosinophilic esophagitis (EoE), potentially exacerbated by allergies. No prior allergy testing. Symptoms may be related to or exacerbated by gastroesophageal reflux disease  (GERD) or nerve damage from previous neck injury. Discussion of need for GI evaluation to confirm diagnosis and guide treatment. - Referred to gastroenterology for evaluation and possible biopsy - Monitor for correlation between allergy flares and dysphagia symptoms. - most common food allergens and rice skin testing 10/27/24: negative  Gastroesophageal reflux disease GERD managed with omeprazole, currently tapering from 40 mg to 20 mg with plans to discontinue over a year. Symptoms have improved with treatment, and choking episodes have decreased in severity. ENT has advised discontinuation of omeprazole due to potential bone density issues. - Continue current tapering regimen of omeprazole as directed by ENT. - Monitor symptoms and adjust treatment as needed.  Follow up : 3 months, sooner if needed-sooner if starting RUSH allergy injections. It was a pleasure seeing you again in clinic today! Thank you for allowing me to participate in your care.  Rocky Endow, MD [facility]  Reducing Pollen Exposure  The American Academy of Allergy, Asthma and Immunology suggests the following steps to reduce your exposure to pollen during allergy seasons.    Do not hang sheets or clothing out to dry; pollen may collect on these items. Do not mow lawns or spend time around freshly cut grass; mowing stirs up pollen. Keep windows closed at night.  Keep car windows closed while driving. Minimize morning activities outdoors, a time when pollen counts are usually at their highest. Stay indoors as much as possible when pollen counts or humidity is high and on windy days when pollen tends to remain in the air longer. Use air conditioning when possible.  Many air conditioners have filters that trap the pollen spores. Use a HEPA room air filter to remove pollen form the indoor air you breathe. DUST MITE AVOIDANCE MEASURES:  There are three main measures that need and can be taken to avoid  house dust mites:  Reduce accumulation of dust in general -reduce furniture, clothing, carpeting, books, stuffed animals, especially in bedroom  Separate yourself from the dust -use pillow and mattress encasements (can be found at stores such as [employer] or online) -avoid direct exposure to air condition flow -use a HEPA filter device, especially in the bedroom; you can also use a HEPA filter vacuum cleaner -wipe dust with a moist towel instead of a dry towel or broom when cleaning  Decrease mites and/or their secretions -wash clothing and linen and stuffed animals at highest temperature possible, at least every 2 weeks -stuffed animals can also be placed in a bag and put in a freezer overnight  Despite the above measures, it is impossible to eliminate dust mites or their allergen completely from your home.  With the above measures the burden of mites in your home can be diminished, with the goal of minimizing your allergic symptoms.  Success will be reached only when implementing and using all means together. Control of Dog or Cat Allergen  Avoidance is the best way to manage a dog or cat allergy. If you have a dog or cat and are allergic to dog or cats, consider removing the dog or cat from the home. If you have a dog or cat but don't want to find it a new home, or if your family wants a pet even though someone in the household is allergic, here are some strategies that may help keep symptoms at bay:  Keep the pet out of your bedroom and restrict it to only a few rooms. Be advised that keeping the dog or cat in only one room will not limit the allergens to that room. Don't pet, hug or kiss the dog or cat; if you do, wash your hands with soap and water. High-efficiency particulate air (HEPA) cleaners run continuously in a bedroom or living room can reduce allergen levels over time. Regular use of a high-efficiency vacuum cleaner or a central vacuum can reduce allergen  levels. Giving your dog or cat a bath at least once a week can reduce airborne allergen.

## 2024-11-30 ENCOUNTER — Telehealth: Payer: Self-pay

## 2024-11-30 MED ORDER — EPINEPHRINE 0.3 MG/0.3ML IJ SOAJ: 0.3000 mg | INTRAMUSCULAR | 1 refills | Status: AC | PRN

## 2024-11-30 MED ORDER — MONTELUKAST SODIUM 10 MG PO TABS: ORAL_TABLET | ORAL | 0 refills | Status: AC

## 2024-11-30 MED ORDER — FAMOTIDINE 20 MG PO TABS: ORAL_TABLET | ORAL | 0 refills | Status: AC

## 2024-11-30 MED ORDER — PREDNISONE 20 MG PO TABS: ORAL_TABLET | ORAL | 0 refills | Status: DC

## 2024-11-30 NOTE — Telephone Encounter (Signed)
 Patient was notify, health check complete, premeds sent to pharmacy.  LVM for patient that premeds is sent to pharmacy on file, to call for questions/concerns.

## 2024-12-01 ENCOUNTER — Other Ambulatory Visit: Payer: Self-pay | Admitting: Internal Medicine

## 2024-12-01 DIAGNOSIS — J3081 Allergic rhinitis due to animal (cat) (dog) hair and dander: Secondary | ICD-10-CM | POA: Diagnosis not present

## 2024-12-01 DIAGNOSIS — J302 Other seasonal allergic rhinitis: Secondary | ICD-10-CM

## 2024-12-01 DIAGNOSIS — J301 Allergic rhinitis due to pollen: Secondary | ICD-10-CM | POA: Diagnosis not present

## 2024-12-01 NOTE — Progress Notes (Signed)
 Aeroallergen Immunotherapy  Ordering Provider: Rocky Endow, MD  Patient Details Name: Scott Macdonald MRN: [id number] Date of Birth: 23-Jul-1986  Order 1 of 2  Vial Label: G/W/T/D  0.3 ml (Volume)  BAU Concentration -- 7 Grass Mix* 100,000 ([place]  Blue, [place], [place], Perennial Rye, RedTop, Sweet Vernal, Delio) 0.2 ml (Volume)  1:20 Concentration -- Bahia 0.3 ml (Volume)  BAU Concentration -- Bermuda 10,000 0.2 ml (Volume)  1:20 Concentration -- Johnson 0.3 ml (Volume)  1:20 Concentration -- Ragweed Mix 0.2 ml (Volume)  1:80 Concentration -- Dogfennel 0.2 ml (Volume)  1:20 Concentration -- Rough Pigweed* 0.2 ml (Volume)  1:10 Concentration -- Cedar, red 0.2 ml (Volume)  1:10 Concentration -- Pecan Pollen 0.2 ml (Volume)  1:20 Concentration -- Walnut, Black Pollen 0.2 ml (Volume)  1:10 Concentration -- Valrie mix* 0.2 ml (Volume)  1:10 Concentration -- Hickory* 0.2 ml (Volume)  1:20 Concentration -- Maple Mix* 0.3 ml (Volume)  1:10 Concentration -- Oak, Eastern mix* 0.5 ml (Volume)  1:10 Concentration -- Dog Epithelia   3.7  ml Extract Subtotal 1.3  ml Diluent  5.0  ml Maintenance Total  Schedule:  RUSH Silver Vial (1:10,000): RUSH Green Vial (1:1,000): RUSH Blue Vial (1:100): RUSH Yellow Vial (1:10): Schedule B (6 doses) Red Vial (1:1): Schedule A (14 doses)  Special Instructions: After completion of the first Red Vial, please space to every two weeks. After completion of the second Red Vial, please space to every 4 weeks. Ok to up dose new vials on Schedule D. Ok to come twice weekly, if desired, as long as there is 48 hours between injections.

## 2024-12-01 NOTE — Progress Notes (Signed)
 Aeroallergen Immunotherapy  Ordering Provider: Rocky Endow, MD  Patient Details Name: Scott Macdonald MRN: [id number] Date of Birth: 1986-01-05  Order 2 of 2  Vial Label: DM/C/CR  0.5 ml (Volume)   AU Concentration -- Mite Mix (DF 5,000 & DP 5,000) 0.5 ml (Volume)  1:10 Concentration -- Cat Hair 0.3 ml (Volume)  1:20 Concentration -- Cockroach, German   1.3  ml Extract Subtotal 3.7  ml Diluent  5.0  ml Maintenance Total  Schedule:  RUSH Silver Vial (1:10,000): RUSH Green Vial (1:1,000): RUSH Blue Vial (1:100): RUSH Yellow Vial (1:10): Schedule B (6 doses) Red Vial (1:1): Schedule A (14 doses)  Special Instructions: After completion of the first Red Vial, please space to every two weeks. After completion of the second Red Vial, please space to every 4 weeks. Ok to up dose new vials on Schedule D. Ok to come twice weekly, if desired, as long as there is 48 hours between injections.

## 2024-12-01 NOTE — Progress Notes (Signed)
 RUSH AIT Rx written.

## 2024-12-01 NOTE — Progress Notes (Signed)
 VIALS MADE ON 12/01/24

## 2024-12-02 DIAGNOSIS — J3081 Allergic rhinitis due to animal (cat) (dog) hair and dander: Secondary | ICD-10-CM | POA: Diagnosis not present

## 2024-12-02 DIAGNOSIS — J3089 Other allergic rhinitis: Secondary | ICD-10-CM | POA: Diagnosis not present

## 2024-12-02 DIAGNOSIS — J302 Other seasonal allergic rhinitis: Secondary | ICD-10-CM | POA: Diagnosis not present

## 2024-12-02 NOTE — Telephone Encounter (Signed)
 Okay, I let him know.

## 2024-12-09 ENCOUNTER — Ambulatory Visit: Admitting: Internal Medicine

## 2024-12-09 NOTE — Telephone Encounter (Signed)
 Called and spoke to pt today, when he no showed for his appt. He mentioned that his kids have the flu and he's home with them. He was advised to call next year to reschedule his last attempt.

## 2025-02-24 ENCOUNTER — Ambulatory Visit: Admitting: Internal Medicine
